# Patient Record
Sex: Female | Born: 1995 | Race: Black or African American | Hispanic: No | Marital: Single | Smoking: Never smoker
Health system: Southern US, Community
[De-identification: ages and names within clinical notes are randomized; demographics above are authoritative.]

## PROBLEM LIST (undated history)

## (undated) DIAGNOSIS — R519 Headache, unspecified: Secondary | ICD-10-CM

## (undated) DIAGNOSIS — R5383 Other fatigue: Secondary | ICD-10-CM

## (undated) DIAGNOSIS — Z789 Other specified health status: Secondary | ICD-10-CM

## (undated) DIAGNOSIS — R011 Cardiac murmur, unspecified: Secondary | ICD-10-CM

## (undated) DIAGNOSIS — I499 Cardiac arrhythmia, unspecified: Secondary | ICD-10-CM

## (undated) DIAGNOSIS — F32A Depression, unspecified: Secondary | ICD-10-CM

## (undated) DIAGNOSIS — R079 Chest pain, unspecified: Secondary | ICD-10-CM

## (undated) HISTORY — DX: Cardiac murmur, unspecified: R01.1

## (undated) HISTORY — DX: Chest pain, unspecified: R07.9

## (undated) HISTORY — DX: Other fatigue: R53.83

## (undated) HISTORY — DX: Headache, unspecified: R51.9

## (undated) HISTORY — DX: Depression, unspecified: F32.A

## (undated) HISTORY — DX: Cardiac arrhythmia, unspecified: I49.9

---
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
## 2019-08-27 ENCOUNTER — Emergency Department (HOSPITAL_COMMUNITY)
Admission: EM | Admit: 2019-08-27 | Discharge: 2019-08-28 | Disposition: A | Discharge status: Home / Self Care | Payer: Self-pay | Attending: Emergency Medicine | Admitting: Emergency Medicine

## 2019-08-27 ENCOUNTER — Other Ambulatory Visit: Payer: Self-pay

## 2019-08-27 ENCOUNTER — Encounter (HOSPITAL_COMMUNITY): Payer: Self-pay | Admitting: *Deleted

## 2019-08-27 DIAGNOSIS — R109 Unspecified abdominal pain: Secondary | ICD-10-CM

## 2019-08-27 DIAGNOSIS — R52 Pain, unspecified: Secondary | ICD-10-CM

## 2019-08-27 DIAGNOSIS — R5383 Other fatigue: Secondary | ICD-10-CM | POA: Insufficient documentation

## 2019-08-27 DIAGNOSIS — Z20828 Contact with and (suspected) exposure to other viral communicable diseases: Secondary | ICD-10-CM | POA: Insufficient documentation

## 2019-08-27 DIAGNOSIS — R05 Cough: Secondary | ICD-10-CM | POA: Insufficient documentation

## 2019-08-27 DIAGNOSIS — R1084 Generalized abdominal pain: Secondary | ICD-10-CM | POA: Insufficient documentation

## 2019-08-27 DIAGNOSIS — R059 Cough, unspecified: Secondary | ICD-10-CM

## 2019-08-27 LAB — URINALYSIS, ROUTINE W REFLEX MICROSCOPIC
Bilirubin Urine: NEGATIVE
Glucose, UA: NEGATIVE mg/dL
Hgb urine dipstick: NEGATIVE
Ketones, ur: NEGATIVE mg/dL
Leukocytes,Ua: NEGATIVE
Nitrite: NEGATIVE
Protein, ur: NEGATIVE mg/dL
Specific Gravity, Urine: 1.016 (ref 1.005–1.030)
pH: 6 (ref 5.0–8.0)

## 2019-08-27 LAB — CBC
HCT: 35.2 % — ABNORMAL LOW (ref 36.0–46.0)
Hemoglobin: 11.8 g/dL — ABNORMAL LOW (ref 12.0–15.0)
MCH: 30.2 pg (ref 26.0–34.0)
MCHC: 33.5 g/dL (ref 30.0–36.0)
MCV: 90 fL (ref 80.0–100.0)
Platelets: 245 10*3/uL (ref 150–400)
RBC: 3.91 MIL/uL (ref 3.87–5.11)
RDW: 13.7 % (ref 11.5–15.5)
WBC: 7.2 10*3/uL (ref 4.0–10.5)
nRBC: 0 % (ref 0.0–0.2)

## 2019-08-27 MED ORDER — SODIUM CHLORIDE 0.9% FLUSH: 3.0000 mL | Freq: Once | INTRAVENOUS | Status: DC

## 2019-08-27 NOTE — ED Triage Notes (Signed)
Pt c/o abd pain, generalized body aches and fatigue x 1 week. Denies known exposure to covid

## 2019-08-28 ENCOUNTER — Emergency Department (HOSPITAL_COMMUNITY): Payer: Self-pay

## 2019-08-28 LAB — COMPREHENSIVE METABOLIC PANEL
ALT: 11 U/L (ref 0–44)
AST: 15 U/L (ref 15–41)
Albumin: 4 g/dL (ref 3.5–5.0)
Alkaline Phosphatase: 56 U/L (ref 38–126)
Anion gap: 9 (ref 5–15)
BUN: 9 mg/dL (ref 6–20)
CO2: 23 mmol/L (ref 22–32)
Calcium: 9.9 mg/dL (ref 8.9–10.3)
Chloride: 106 mmol/L (ref 98–111)
Creatinine, Ser: 0.77 mg/dL (ref 0.44–1.00)
GFR calc Af Amer: >60 mL/min (ref >60)
GFR calc non Af Amer: >60 mL/min (ref >60)
Glucose, Bld: 106 mg/dL — ABNORMAL HIGH (ref 70–99)
Potassium: 3.6 mmol/L (ref 3.5–5.1)
Sodium: 138 mmol/L (ref 135–145)
Total Bilirubin: 0.6 mg/dL (ref 0.3–1.2)
Total Protein: 7.3 g/dL (ref 6.5–8.1)

## 2019-08-28 LAB — I-STAT BETA HCG BLOOD, ED (MC, WL, AP ONLY): I-stat hCG, quantitative: <5 m[IU]/mL (ref <5)

## 2019-08-28 NOTE — ED Provider Notes (Signed)
[facility] EMERGENCY DEPARTMENT Provider Note   CSN: [id number] Arrival date & time: 08/27/19  2307     History   Chief Complaint Chief Complaint  Patient presents with  . Abdominal Pain    HPI Madison Rocha is a 23 y.o. female.     Patient presents to the emergency department with a chief complaint of "pain all over."  She states that she has been having generalized fatigue and body aches x1 week.  She reports some associated cough.  She denies any fevers.  Denies any known coronavirus exposures.  Denies any successful treatments prior to arrival.  Nothing makes symptoms better or worse.  Madison Rocha was evaluated in Emergency Department on 08/28/2019 for the symptoms described in the history of present illness. She was evaluated in the context of the global COVID-19 pandemic, which necessitated consideration that the patient might be at risk for infection with the SARS-CoV-2 virus that causes COVID-19. Institutional protocols and algorithms that pertain to the evaluation of patients at risk for COVID-19 are in a state of rapid change based on information released by regulatory bodies including the CDC and federal and state organizations. These policies and algorithms were followed during the patient's care in the ED.   The history is provided by the patient. No language interpreter was used.    History reviewed. No pertinent past medical history.  There are no active problems to display for this patient.   History reviewed. No pertinent surgical history.   OB History   No obstetric history on file.      Home Medications    Prior to Admission medications   Not on File    Family History No family history on file.  Social History Social History   Tobacco Use  . Smoking status: Never Smoker  . Smokeless tobacco: Never Used  Substance Use Topics  . Alcohol use: Never    Frequency: Never  . Drug use: Never     Allergies    Patient has no known allergies.   Review of Systems Review of Systems  All other systems reviewed and are negative.    Physical Exam Updated Vital Signs BP 130/73 (BP Location: Left Arm)   Pulse 60   Temp 98.2 F (36.8 C) (Oral)   Resp 17   LMP 08/20/2019   SpO2 100%   Physical Exam Vitals signs and nursing note reviewed.  Constitutional:      General: She is not in acute distress.    Appearance: She is well-developed.  HENT:     Head: Normocephalic and atraumatic.  Eyes:     Conjunctiva/sclera: Conjunctivae normal.  Neck:     Musculoskeletal: Neck supple.  Cardiovascular:     Rate and Rhythm: Normal rate and regular rhythm.     Heart sounds: No murmur.  Pulmonary:     Effort: Pulmonary effort is normal. No respiratory distress.     Breath sounds: Normal breath sounds.  Abdominal:     Palpations: Abdomen is soft.     Tenderness: There is no abdominal tenderness.  Musculoskeletal: Normal range of motion.  Skin:    General: Skin is warm and dry.  Neurological:     Mental Status: She is alert and oriented to person, place, and time.  Psychiatric:        Mood and Affect: Mood normal.        Behavior: Behavior normal.      ED Treatments / Results  Labs (all labs  ordered are listed, but only abnormal results are displayed) Labs Reviewed  COMPREHENSIVE METABOLIC PANEL - Abnormal; Notable for the following components:      Result Value   Glucose, Bld 106 (*)    All other components within normal limits  CBC - Abnormal; Notable for the following components:   Hemoglobin 11.8 (*)    HCT 35.2 (*)    All other components within normal limits  NOVEL CORONAVIRUS, NAA (HOSP ORDER, SEND-OUT TO REF LAB; TAT 18-24 HRS)  URINALYSIS, ROUTINE W REFLEX MICROSCOPIC  I-STAT BETA HCG BLOOD, ED ([facility], [facility], [facility] ONLY)    EKG None  Radiology No results found.  Procedures Procedures (including critical care time)  Medications Ordered in ED Medications  sodium chloride  flush (NS) 0.9 % injection 3 mL (has no administration in time range)     Initial Impression / Assessment and Plan / ED Course  I have reviewed the triage vital signs and the nursing notes.  Pertinent labs & imaging results that were available during my care of the patient were reviewed by me and considered in my medical decision making (see chart for details).        Patient with generalized fatigue and body aches.  Afebrile here.  Laboratory work-up is reassuring.  She has had some cough.  Will check chest x-ray and Covid.  Recommend isolating at home until Covid results.  Otherwise, doubt the need for any further emergent work-up.  Patient can follow-up with PCP.  Final Clinical Impressions(s) / ED Diagnoses   Final diagnoses:  Cough  Body aches  Fatigue, unspecified type  Abdominal pain, unspecified abdominal location    ED Discharge Orders    None       Montine Circle, PA-C 08/28/19 4801    Veryl Speak, MD 08/28/19 [phone]

## 2019-08-28 NOTE — ED Notes (Signed)
Patient verbalizes understanding of discharge instructions. Opportunity for questioning and answers were provided. Armband removed by staff, pt discharged from ED.  

## 2019-08-28 NOTE — Discharge Instructions (Addendum)
You need to isolate at home until your COVID test results.  No clear cause of your symptoms was found tonight.  It is possible that you have COVID.  If the test results are negative, you can return to your normal routine and follow-up with your doctor.  If the COVID test is positive, you will need to isolate for 7 days.      Person Under Monitoring Name: Madison Rocha  Location: [place] [address]   Infection Prevention Recommendations for Individuals Confirmed to have, or Being Evaluated for, 2019 Novel Coronavirus (COVID-19) Infection Who Receive Care at Home  Individuals who are confirmed to have, or are being evaluated for, COVID-19 should follow the prevention steps below until a healthcare provider or local or state health department says they can return to normal activities.  Stay home except to get medical care You should restrict activities outside your home, except for getting medical care. Do not go to work, school, or public areas, and do not use public transportation or taxis.  Call ahead before visiting your doctor Before your medical appointment, call the healthcare provider and tell them that you have, or are being evaluated for, COVID-19 infection. This will help the healthcare providers office take steps to keep other people from getting infected. Ask your healthcare provider to call the local or state health department.  Monitor your symptoms Seek prompt medical attention if your illness is worsening (e.g., difficulty breathing). Before going to your medical appointment, call the healthcare provider and tell them that you have, or are being evaluated for, COVID-19 infection. Ask your healthcare provider to call the local or state health department.  Wear a facemask You should wear a facemask that covers your nose and mouth when you are in the same room with other people and when you visit a healthcare provider. People who live  with or visit you should also wear a facemask while they are in the same room with you.  Separate yourself from other people in your home As much as possible, you should stay in a different room from other people in your home. Also, you should use a separate bathroom, if available.  Avoid sharing household items You should not share dishes, drinking glasses, cups, eating utensils, towels, bedding, or other items with other people in your home. After using these items, you should wash them thoroughly with soap and water.  Cover your coughs and sneezes Cover your mouth and nose with a tissue when you cough or sneeze, or you can cough or sneeze into your sleeve. Throw used tissues in a lined trash can, and immediately wash your hands with soap and water for at least 20 seconds or use an alcohol-based hand rub.  Wash your [employer] your hands often and thoroughly with soap and water for at least 20 seconds. You can use an alcohol-based hand sanitizer if soap and water are not available and if your hands are not visibly dirty. Avoid touching your eyes, nose, and mouth with unwashed hands.   Prevention Steps for Caregivers and Household Members of Individuals Confirmed to have, or Being Evaluated for, COVID-19 Infection Being Cared for in the Home  If you live with, or provide care at home for, a person confirmed to have, or being evaluated for, COVID-19 infection please follow these guidelines to prevent infection:  Follow healthcare providers instructions Make sure that you understand and can help the patient follow any healthcare provider instructions for all care.  Provide for the patients basic needs You should help the patient with basic needs in the home and provide support for getting groceries, prescriptions, and other personal needs.  Monitor the patients symptoms If they are getting sicker, call his or her medical provider and tell them that the patient has, or is being  evaluated for, COVID-19 infection. This will help the healthcare providers office take steps to keep other people from getting infected. Ask the healthcare provider to call the local or state health department.  Limit the number of people who have contact with the patient If possible, have only one caregiver for the patient. Other household members should stay in another home or place of residence. If this is not possible, they should stay in another room, or be separated from the patient as much as possible. Use a separate bathroom, if available. Restrict visitors who do not have an essential need to be in the home.  Keep older adults, very young children, and other sick people away from the patient Keep older adults, very young children, and those who have compromised immune systems or chronic health conditions away from the patient. This includes people with chronic heart, lung, or kidney conditions, diabetes, and cancer.  Ensure good ventilation Make sure that shared spaces in the home have good air flow, such as from an air conditioner or an opened window, weather permitting.  Wash your hands often Wash your hands often and thoroughly with soap and water for at least 20 seconds. You can use an alcohol based hand sanitizer if soap and water are not available and if your hands are not visibly dirty. Avoid touching your eyes, nose, and mouth with unwashed hands. Use disposable paper towels to dry your hands. If not available, use dedicated cloth towels and replace them when they become wet.  Wear a facemask and gloves Wear a disposable facemask at all times in the room and gloves when you touch or have contact with the patients blood, body fluids, and/or secretions or excretions, such as sweat, saliva, sputum, nasal mucus, vomit, urine, or feces.  Ensure the mask fits over your nose and mouth tightly, and do not touch it during use. Throw out disposable facemasks and gloves after using  them. Do not reuse. Wash your hands immediately after removing your facemask and gloves. If your personal clothing becomes contaminated, carefully remove clothing and launder. Wash your hands after handling contaminated clothing. Place all used disposable facemasks, gloves, and other waste in a lined container before disposing them with other household waste. Remove gloves and wash your hands immediately after handling these items.  Do not share dishes, glasses, or other household items with the patient Avoid sharing household items. You should not share dishes, drinking glasses, cups, eating utensils, towels, bedding, or other items with a patient who is confirmed to have, or being evaluated for, COVID-19 infection. After the person uses these items, you should wash them thoroughly with soap and water.  Wash laundry thoroughly Immediately remove and wash clothes or bedding that have blood, body fluids, and/or secretions or excretions, such as sweat, saliva, sputum, nasal mucus, vomit, urine, or feces, on them. Wear gloves when handling laundry from the patient. Read and follow directions on labels of laundry or clothing items and detergent. In general, wash and dry with the warmest temperatures recommended on the label.  Clean all areas the individual has used often Clean all touchable surfaces, such as counters, tabletops, doorknobs, bathroom fixtures, toilets, phones, keyboards, tablets,  and bedside tables, every day. Also, clean any surfaces that may have blood, body fluids, and/or secretions or excretions on them. Wear gloves when cleaning surfaces the patient has come in contact with. Use a diluted bleach solution (e.g., dilute bleach with 1 part bleach and 10 parts water) or a household disinfectant with a label that says EPA-registered for coronaviruses. To make a bleach solution at home, add 1 tablespoon of bleach to 1 quart (4 cups) of water. For a larger supply, add  cup of bleach to 1  gallon (16 cups) of water. Read labels of cleaning products and follow recommendations provided on product labels. Labels contain instructions for safe and effective use of the cleaning product including precautions you should take when applying the product, such as wearing gloves or eye protection and making sure you have good ventilation during use of the product. Remove gloves and wash hands immediately after cleaning.  Monitor yourself for signs and symptoms of illness Caregivers and household members are considered close contacts, should monitor their health, and will be asked to limit movement outside of the home to the extent possible. Follow the monitoring steps for close contacts listed on the symptom monitoring form.   ? If you have additional questions, contact your local health department or call the epidemiologist on call at [phone] (available 24/7). ? This guidance is subject to change. For the most up-to-date guidance from [facility], please refer to their website: TripMetro.huhttps://www.cdc.gov/coronavirus/2019-ncov/hcp/guidance-prevent-spread.html

## 2019-08-29 ENCOUNTER — Telehealth (HOSPITAL_COMMUNITY): Payer: Self-pay

## 2019-08-29 LAB — NOVEL CORONAVIRUS, NAA (HOSP ORDER, SEND-OUT TO REF LAB; TAT 18-24 HRS): SARS-CoV-2, NAA: NOT DETECTED

## 2019-10-16 DIAGNOSIS — Z3201 Encounter for pregnancy test, result positive: Secondary | ICD-10-CM | POA: Diagnosis not present

## 2019-10-26 NOTE — L&D Delivery Note (Signed)
Delivery Note:   G2P1001 at [redacted]w[redacted]d  Admitting diagnosis: Normal labor [O80, Z37.9] Risks: None Onset of labor: 06/19/2020 @ 0400 IOL/Augmentation: N/A ROM: AROM @ 1238  Complete dilation at 06/19/2020  1251 Onset of pushing at 1251 FHR second stage Cat 1  Analgesia /Anesthesia intrapartum:None  Pushing in lithotomy position with CNM and L&D staff support, pt's mother present for birth and supportive.  Delivery of a Live born female  Birth Weight:  3711 gms, 8lbs 2.9oz APGAR: 9, 9   Newborn Delivery   Birth date/time: 06/19/2020 13:00:00 Delivery type: Vaginal, spontaneous      in cephalic presentation, position OA to LOA.  APGAR:1 min-9 , 5 min-9  10 min-  Nuchal Cord: No  Cord double clamped after cessation of pulsation, cut by mother.  Collection of cord blood for typing completed. Cord blood donation-None  Arterial cord blood sample-No    Placenta delivered-Spontaneous  with 3 vessels . Uterotonics: Pitocin, methergine IM Placenta to L&D. Uterine tone firm bleeding stable  1st degree  laceration identified.  Episiotomy:None  Local analgesia: Lidocaine  Repair: 3-0 in the usual fashion, excellent hemostasis Est. Blood Loss (mL):300.00   Complications: None  Mom to postpartum.  Darlen Round to Couplet care / Skin to Skin.  Delivery Report:   Review the Delivery Report for details.    Signed: Clancy Gourd, MSN 06/19/2020, 3:04 PM

## 2019-10-28 ENCOUNTER — Encounter (HOSPITAL_COMMUNITY): Payer: Self-pay

## 2019-10-28 ENCOUNTER — Inpatient Hospital Stay (HOSPITAL_COMMUNITY)
Admission: AD | Admit: 2019-10-28 | Discharge: 2019-10-29 | Disposition: A | Discharge status: Home / Self Care | Payer: No Typology Code available for payment source | Attending: Obstetrics and Gynecology | Admitting: Obstetrics and Gynecology

## 2019-10-28 DIAGNOSIS — R109 Unspecified abdominal pain: Secondary | ICD-10-CM | POA: Diagnosis not present

## 2019-10-28 DIAGNOSIS — O26899 Other specified pregnancy related conditions, unspecified trimester: Secondary | ICD-10-CM

## 2019-10-28 DIAGNOSIS — O26891 Other specified pregnancy related conditions, first trimester: Secondary | ICD-10-CM | POA: Diagnosis not present

## 2019-10-28 DIAGNOSIS — S3991XA Unspecified injury of abdomen, initial encounter: Secondary | ICD-10-CM

## 2019-10-28 DIAGNOSIS — W208XXA Other cause of strike by thrown, projected or falling object, initial encounter: Secondary | ICD-10-CM | POA: Diagnosis not present

## 2019-10-28 DIAGNOSIS — Z3A01 Less than 8 weeks gestation of pregnancy: Secondary | ICD-10-CM | POA: Diagnosis not present

## 2019-10-28 LAB — COMPREHENSIVE METABOLIC PANEL
ALT: 13 U/L (ref 0–44)
AST: 17 U/L (ref 15–41)
Albumin: 4.2 g/dL (ref 3.5–5.0)
Alkaline Phosphatase: 57 U/L (ref 38–126)
Anion gap: 8 (ref 5–15)
BUN: 9 mg/dL (ref 6–20)
CO2: 22 mmol/L (ref 22–32)
Calcium: 9.4 mg/dL (ref 8.9–10.3)
Chloride: 103 mmol/L (ref 98–111)
Creatinine, Ser: 0.61 mg/dL (ref 0.44–1.00)
GFR calc Af Amer: >60 mL/min (ref >60)
GFR calc non Af Amer: >60 mL/min (ref >60)
Glucose, Bld: 97 mg/dL (ref 70–99)
Potassium: 4.3 mmol/L (ref 3.5–5.1)
Sodium: 133 mmol/L — ABNORMAL LOW (ref 135–145)
Total Bilirubin: 0.2 mg/dL — ABNORMAL LOW (ref 0.3–1.2)
Total Protein: 7.3 g/dL (ref 6.5–8.1)

## 2019-10-28 LAB — URINALYSIS, ROUTINE W REFLEX MICROSCOPIC
Bacteria, UA: NONE SEEN
Bilirubin Urine: NEGATIVE
Glucose, UA: NEGATIVE mg/dL
Hgb urine dipstick: NEGATIVE
Ketones, ur: NEGATIVE mg/dL
Nitrite: NEGATIVE
Protein, ur: NEGATIVE mg/dL
Specific Gravity, Urine: 1.016 (ref 1.005–1.030)
pH: 6 (ref 5.0–8.0)

## 2019-10-28 LAB — CBC
HCT: 37.8 % (ref 36.0–46.0)
Hemoglobin: 12.4 g/dL (ref 12.0–15.0)
MCH: 30.1 pg (ref 26.0–34.0)
MCHC: 32.8 g/dL (ref 30.0–36.0)
MCV: 91.7 fL (ref 80.0–100.0)
Platelets: 257 10*3/uL (ref 150–400)
RBC: 4.12 MIL/uL (ref 3.87–5.11)
RDW: 14 % (ref 11.5–15.5)
WBC: 10.3 10*3/uL (ref 4.0–10.5)
nRBC: 0 % (ref 0.0–0.2)

## 2019-10-28 LAB — LIPASE, BLOOD: Lipase: 22 U/L (ref 11–51)

## 2019-10-28 LAB — I-STAT BETA HCG BLOOD, ED (MC, WL, AP ONLY): I-stat hCG, quantitative: >2000 m[IU]/mL — ABNORMAL HIGH (ref <5)

## 2019-10-28 MED ORDER — SODIUM CHLORIDE 0.9% FLUSH: 3.0000 mL | Freq: Once | INTRAVENOUS | Status: DC
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
## 2019-10-29 ENCOUNTER — Encounter (HOSPITAL_COMMUNITY): Payer: Self-pay | Admitting: Obstetrics and Gynecology

## 2019-10-29 ENCOUNTER — Inpatient Hospital Stay (HOSPITAL_COMMUNITY): Payer: Medicaid Other | Attending: Family Medicine

## 2019-10-29 ENCOUNTER — Other Ambulatory Visit: Payer: Self-pay

## 2019-10-29 DIAGNOSIS — O26891 Other specified pregnancy related conditions, first trimester: Secondary | ICD-10-CM | POA: Diagnosis not present

## 2019-10-29 DIAGNOSIS — Z3A01 Less than 8 weeks gestation of pregnancy: Secondary | ICD-10-CM | POA: Insufficient documentation

## 2019-10-29 DIAGNOSIS — R109 Unspecified abdominal pain: Secondary | ICD-10-CM | POA: Insufficient documentation

## 2019-10-29 LAB — TYPE AND SCREEN
ABO/RH(D): AB POS
Antibody Screen: NEGATIVE

## 2019-10-29 LAB — ABO/RH: ABO/RH(D): AB POS

## 2019-10-29 LAB — HCG, QUANTITATIVE, PREGNANCY: hCG, Beta Chain, Quant, S: 66049 m[IU]/mL — ABNORMAL HIGH (ref <5)

## 2019-10-29 NOTE — ED Notes (Signed)
[facility] in [facility] approved transport to them.

## 2019-10-29 NOTE — Discharge Instructions (Signed)
Abdominal Pain During Pregnancy  Belly (abdominal) pain is common during pregnancy. There are many possible causes. Most of the time, it is not a serious problem. Other times, it can be a sign that something is wrong with the pregnancy. Always tell your doctor if you have belly pain. Follow these instructions at home:  Do not have sex or put anything in your vagina until your pain goes away completely.  Get plenty of rest until your pain gets better.  Drink enough fluid to keep your pee (urine) pale yellow.  Take over-the-counter and prescription medicines only as told by your doctor.  Keep all follow-up visits as told by your doctor. This is important. Contact a doctor if:  Your pain continues or gets worse after resting.  You have lower belly pain that: ? Comes and goes at regular times. ? Spreads to your back. ? Feels like menstrual cramps.  You have pain or burning when you pee (urinate). Get help right away if:  You have a fever or chills.  You have vaginal bleeding.  You are leaking fluid from your vagina.  You are passing tissue from your vagina.  You throw up (vomit) for more than 24 hours.  You have watery poop (diarrhea) for more than 24 hours.  Your baby is moving less than usual.  You feel very weak or faint.  You have shortness of breath.  You have very bad pain in your upper belly. Summary  Belly (abdominal) pain is common during pregnancy. There are many possible causes.  If you have belly pain during pregnancy, tell your doctor right away.  Keep all follow-up visits as told by your doctor. This is important. This information is not intended to replace advice given to you by your health care provider. Make sure you discuss any questions you have with your health care provider. Document Revised: 01/29/2019 Document Reviewed: 01/13/2017 Elsevier Patient Education  2020 Elsevier Inc.   [place] [facility] Providers    Center for [employer] at [facility]       Phone: [phone]  Center for [employer] at [place]   Phone: [phone]  Center for [employer] at [place]  Phone: [phone]  Center for [facility] at [facility]  Phone: [phone]  Center for [facility] at [place]  Phone: [phone]  [facility] for [facility] at [facility]   Phone: [phone]  [facility]       Phone: [phone]  [facility]    Phone: [phone]   [facility]    Phone: [phone]  [facility]    Phone: [phone]  [facility]    Phone: [phone]  [facility]-Family Planning       Phone: [phone]   [facility]  Phone: [phone]  [facility]    Phone: [phone]  [facility]   Phone: [phone]  [facility]      Phone: [phone]  [facility]    Phone: [phone]
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
## 2019-10-29 NOTE — MAU Provider Note (Signed)
History     CSN: [id number]  Arrival date and time: 10/28/19 2052   None     Chief Complaint  Patient presents with  . Abdominal Pain   HPI   Madison Rocha is a 24 yo G2P1001 who is unsure about her LMP or EGA. She presents to MAU for abdominal pain after having a box fall on her abdomen. She works for [employer], and is unsure of how large the box was or from how high up it fell because it happened so fast. She denies vaginal bleeding or cramping. Her abdomen is still a little tender from where the box fell, which was on the right lower part of her abdomen. She denies any bleeding, bruising, or lacerations to the abdomen. She says her pain is a throbbing, and it does not radiate anywhere.  OB History    Gravida  2   Para  1   Term  1   Preterm      AB      Living  1     SAB      TAB      Ectopic      Multiple      Live Births  1           History reviewed. No pertinent past medical history.  History reviewed. No pertinent surgical history.  History reviewed. No pertinent family history.  Social History   Tobacco Use  . Smoking status: Never Smoker  . Smokeless tobacco: Never Used  Substance Use Topics  . Alcohol use: Never  . Drug use: Never    Allergies: No Known Allergies  No medications prior to admission.    Review of Systems  All other systems reviewed and are negative.  Physical Exam   Blood pressure 127/73, pulse 68, temperature 98.8 F (37.1 C), temperature source Oral, resp. rate 16, height 5\' 8"  (1.727 m), weight 74.7 kg, last menstrual period 09/10/2019, SpO2 100 %.  Physical Exam  Nursing note and vitals reviewed. Constitutional: She is oriented to person, place, and time. She appears well-developed and well-nourished.  HENT:  Head: Normocephalic and atraumatic.  Eyes: Pupils are equal, round, and reactive to light. Conjunctivae and EOM are normal.  Cardiovascular: Normal rate, regular rhythm, normal heart sounds and intact distal  pulses.  Respiratory: Effort normal and breath sounds normal.  GI: Soft. Bowel sounds are normal. She exhibits no distension and no mass. There is abdominal tenderness (mild over RLQ, no gaurding or rigidity). There is no rebound and no guarding.  Musculoskeletal:        General: Normal range of motion.     Cervical back: Normal range of motion and neck supple.  Neurological: She is alert and oriented to person, place, and time. She has normal reflexes.  Skin: Skin is warm and dry.  Psychiatric: She has a normal mood and affect. Her behavior is normal. Judgment and thought content normal.    MAU Course  Procedures  MDM -Lab work in ED unremarkable -[place] ordered for dating and viability, bHCG quant -Type and Screen  Results for orders placed or performed during the hospital encounter of 10/28/19 (from the past 24 hour(s))  Urinalysis, Routine w reflex microscopic     Status: Abnormal   Collection Time: 10/28/19  8:59 PM  Result Value Ref Range   Color, Urine YELLOW YELLOW   APPearance HAZY (A) CLEAR   Specific Gravity, Urine 1.016 1.005 - 1.030   pH 6.0 5.0 - 8.0  Glucose, UA NEGATIVE NEGATIVE mg/dL   Hgb urine dipstick NEGATIVE NEGATIVE   Bilirubin Urine NEGATIVE NEGATIVE   Ketones, ur NEGATIVE NEGATIVE mg/dL   Protein, ur NEGATIVE NEGATIVE mg/dL   Nitrite NEGATIVE NEGATIVE   Leukocytes,Ua TRACE (A) NEGATIVE   RBC / HPF 0-5 0 - 5 RBC/hpf   WBC, UA 0-5 0 - 5 WBC/hpf   Bacteria, UA NONE SEEN NONE SEEN   Squamous Epithelial / LPF 0-5 0 - 5   Mucus PRESENT   Lipase, blood     Status: None   Collection Time: 10/28/19  9:12 PM  Result Value Ref Range   Lipase 22 11 - 51 U/L  Comprehensive metabolic panel     Status: Abnormal   Collection Time: 10/28/19  9:12 PM  Result Value Ref Range   Sodium 133 (L) 135 - 145 mmol/L   Potassium 4.3 3.5 - 5.1 mmol/L   Chloride 103 98 - 111 mmol/L   CO2 22 22 - 32 mmol/L   Glucose, Bld 97 70 - 99 mg/dL   BUN 9 6 - 20 mg/dL   Creatinine,  Ser 6.60 0.44 - 1.00 mg/dL   Calcium 9.4 8.9 - 63.0 mg/dL   Total Protein 7.3 6.5 - 8.1 g/dL   Albumin 4.2 3.5 - 5.0 g/dL   AST 17 15 - 41 U/L   ALT 13 0 - 44 U/L   Alkaline Phosphatase 57 38 - 126 U/L   Total Bilirubin 0.2 (L) 0.3 - 1.2 mg/dL   GFR calc non Af Amer >60 >60 mL/min   GFR calc Af Amer >60 >60 mL/min   Anion gap 8 5 - 15  CBC     Status: None   Collection Time: 10/28/19  9:12 PM  Result Value Ref Range   WBC 10.3 4.0 - 10.5 K/uL   RBC 4.12 3.87 - 5.11 MIL/uL   Hemoglobin 12.4 12.0 - 15.0 g/dL   HCT 16.0 10.9 - 32.3 %   MCV 91.7 80.0 - 100.0 fL   MCH 30.1 26.0 - 34.0 pg   MCHC 32.8 30.0 - 36.0 g/dL   RDW 55.7 32.2 - 02.5 %   Platelets 257 150 - 400 K/uL   nRBC 0.0 0.0 - 0.2 %  I-Stat beta hCG blood, ED     Status: Abnormal   Collection Time: 10/28/19  9:22 PM  Result Value Ref Range   I-stat hCG, quantitative >2,000.0 (H) <5 mIU/mL   Comment 3          hCG, quantitative, pregnancy     Status: Abnormal   Collection Time: 10/29/19  4:16 AM  Result Value Ref Range   hCG, Beta Chain, Quant, S 66,049 (H) <5 mIU/mL  Type and screen [facility]     Status: None   Collection Time: 10/29/19  4:17 AM  Result Value Ref Range   ABO/RH(D) AB POS    Antibody Screen NEG    Sample Expiration      11/01/2019,2359 Performed at [facility], [address], [place], [place] [address]    [place] Comp Less 14 Wks  Result Date: 10/29/2019 CLINICAL DATA:  Initial evaluation for acute abdominal pain, early pregnancy. EXAM: OBSTETRIC <14 WK ULTRASOUND TECHNIQUE: Transabdominal ultrasound was performed for evaluation of the gestation as well as the maternal uterus and adnexal regions. COMPARISON:  None. FINDINGS: Intrauterine gestational sac: Single Yolk sac:  Present Embryo:  Present Cardiac Activity: Present Heart Rate: 134 bpm CRL: 10.3  mm   7 w 1 d                  [place] EDC: 06/15/2020 Subchorionic hemorrhage:  None visualized. Maternal uterus/adnexae: Right  ovary normal in appearance. Left ovary not visualized. No adnexal mass or free fluid. IMPRESSION: 1. Single viable intrauterine pregnancy as above, estimated gestational age [redacted] weeks and 1 day by crown-rump length, with ultrasound EDC of 06/15/2020. No complication. 2. No other acute maternal uterine or adnexal abnormality. Electronically Signed   By: Rise Mu M.D.   On: 10/29/2019 05:04     Assessment and Plan  24 yo G2P1001 at 7.1 EGA by [place] who presented to MAU after having a box fall on her abdomen -SIUP on [place], no subchorionic hemorrahge -DC home with expectant management -Tylenol as needed for pain -List of OB providers given  Laresha Bacorn L Breya Cass 10/29/2019, 5:33 AM

## 2019-11-14 DIAGNOSIS — R109 Unspecified abdominal pain: Secondary | ICD-10-CM | POA: Diagnosis not present

## 2019-11-14 DIAGNOSIS — R1084 Generalized abdominal pain: Secondary | ICD-10-CM | POA: Diagnosis not present

## 2019-11-14 DIAGNOSIS — Z3A09 9 weeks gestation of pregnancy: Secondary | ICD-10-CM | POA: Diagnosis not present

## 2019-11-14 DIAGNOSIS — R1031 Right lower quadrant pain: Secondary | ICD-10-CM | POA: Diagnosis not present

## 2019-11-14 DIAGNOSIS — B9689 Other specified bacterial agents as the cause of diseases classified elsewhere: Secondary | ICD-10-CM | POA: Diagnosis not present

## 2019-11-14 DIAGNOSIS — O26891 Other specified pregnancy related conditions, first trimester: Secondary | ICD-10-CM | POA: Diagnosis not present

## 2019-11-14 DIAGNOSIS — R1032 Left lower quadrant pain: Secondary | ICD-10-CM | POA: Diagnosis not present

## 2019-11-14 DIAGNOSIS — O99891 Other specified diseases and conditions complicating pregnancy: Secondary | ICD-10-CM | POA: Diagnosis not present

## 2019-11-14 DIAGNOSIS — O23591 Infection of other part of genital tract in pregnancy, first trimester: Secondary | ICD-10-CM | POA: Diagnosis not present

## 2019-12-12 DIAGNOSIS — Z113 Encounter for screening for infections with a predominantly sexual mode of transmission: Secondary | ICD-10-CM | POA: Diagnosis not present

## 2019-12-12 DIAGNOSIS — Z124 Encounter for screening for malignant neoplasm of cervix: Secondary | ICD-10-CM | POA: Diagnosis not present

## 2019-12-12 DIAGNOSIS — Z3491 Encounter for supervision of normal pregnancy, unspecified, first trimester: Secondary | ICD-10-CM | POA: Diagnosis not present

## 2019-12-12 DIAGNOSIS — Z3201 Encounter for pregnancy test, result positive: Secondary | ICD-10-CM | POA: Diagnosis not present

## 2019-12-12 DIAGNOSIS — Z3A13 13 weeks gestation of pregnancy: Secondary | ICD-10-CM | POA: Diagnosis not present

## 2019-12-12 DIAGNOSIS — O3680X9 Pregnancy with inconclusive fetal viability, other fetus: Secondary | ICD-10-CM | POA: Diagnosis not present

## 2020-01-15 ENCOUNTER — Other Ambulatory Visit (HOSPITAL_COMMUNITY): Payer: Self-pay | Admitting: Obstetrics and Gynecology

## 2020-01-15 DIAGNOSIS — Z3A20 20 weeks gestation of pregnancy: Secondary | ICD-10-CM

## 2020-01-15 DIAGNOSIS — Z3482 Encounter for supervision of other normal pregnancy, second trimester: Secondary | ICD-10-CM | POA: Diagnosis not present

## 2020-01-15 DIAGNOSIS — Z363 Encounter for antenatal screening for malformations: Secondary | ICD-10-CM

## 2020-01-23 ENCOUNTER — Other Ambulatory Visit (HOSPITAL_COMMUNITY): Payer: Self-pay | Admitting: Obstetrics and Gynecology

## 2020-02-01 ENCOUNTER — Ambulatory Visit (HOSPITAL_COMMUNITY): Payer: Medicaid Other

## 2020-02-01 ENCOUNTER — Encounter (HOSPITAL_COMMUNITY): Payer: Self-pay

## 2020-02-11 ENCOUNTER — Other Ambulatory Visit: Payer: Self-pay

## 2020-02-11 ENCOUNTER — Other Ambulatory Visit (HOSPITAL_COMMUNITY): Payer: Self-pay | Admitting: Obstetrics and Gynecology

## 2020-02-11 ENCOUNTER — Ambulatory Visit (HOSPITAL_COMMUNITY)
Admission: RE | Admit: 2020-02-11 | Discharge: 2020-02-11 | Disposition: A | Discharge status: Home / Self Care | Payer: Medicaid Other | Source: Ambulatory Visit | Attending: Obstetrics and Gynecology | Admitting: Obstetrics and Gynecology

## 2020-02-11 DIAGNOSIS — Z3A22 22 weeks gestation of pregnancy: Secondary | ICD-10-CM | POA: Diagnosis not present

## 2020-02-11 DIAGNOSIS — Z363 Encounter for antenatal screening for malformations: Secondary | ICD-10-CM | POA: Diagnosis not present

## 2020-02-11 DIAGNOSIS — Z3A20 20 weeks gestation of pregnancy: Secondary | ICD-10-CM | POA: Insufficient documentation

## 2020-02-11 DIAGNOSIS — O359XX0 Maternal care for (suspected) fetal abnormality and damage, unspecified, not applicable or unspecified: Secondary | ICD-10-CM | POA: Diagnosis not present

## 2020-02-12 ENCOUNTER — Other Ambulatory Visit (HOSPITAL_COMMUNITY): Payer: Self-pay | Admitting: *Deleted

## 2020-02-12 DIAGNOSIS — Z362 Encounter for other antenatal screening follow-up: Secondary | ICD-10-CM

## 2020-02-25 IMAGING — US US OB COMP LESS 14 WK
1 series · 15 of 22 positions shown · non-contrast
Comparison: None.

CLINICAL DATA: Initial evaluation for acute abdominal pain, early
pregnancy.

EXAM:
OBSTETRIC <14 WK ULTRASOUND
TECHNIQUE: Transabdominal ultrasound was performed for evaluation of the
gestation as well as the maternal uterus and adnexal regions.

[Series 1: us ob comp less 14 wk · 22 acquisitions, 15 frames shown]
[im 1/22]
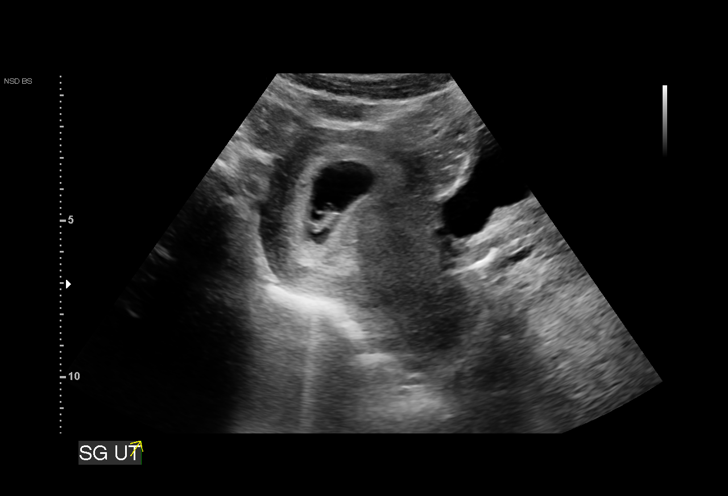
[im 3/22]
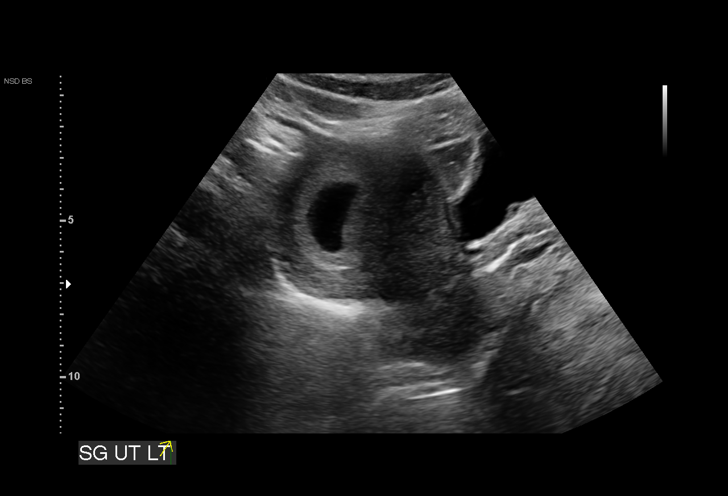
[im 4/22]
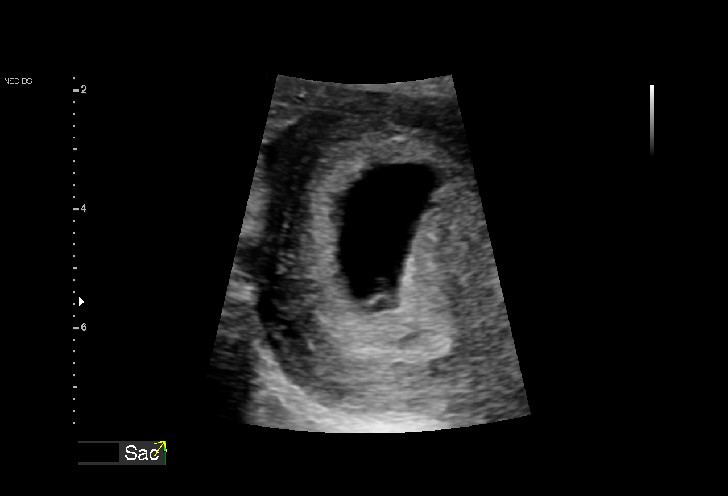
[im 6/22]
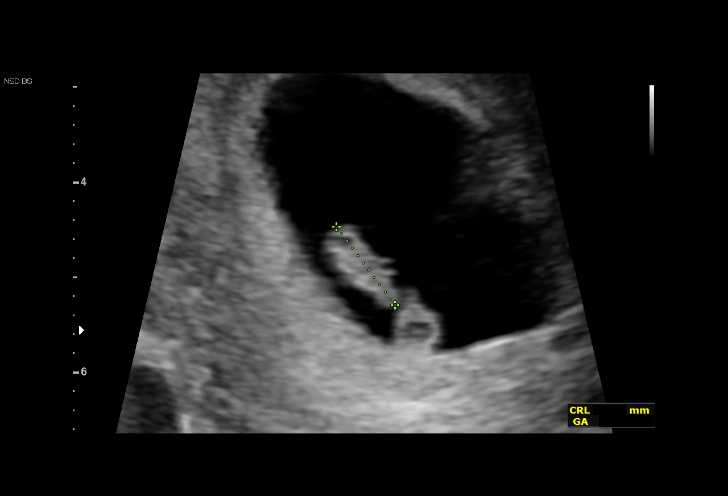
[im 7/22]
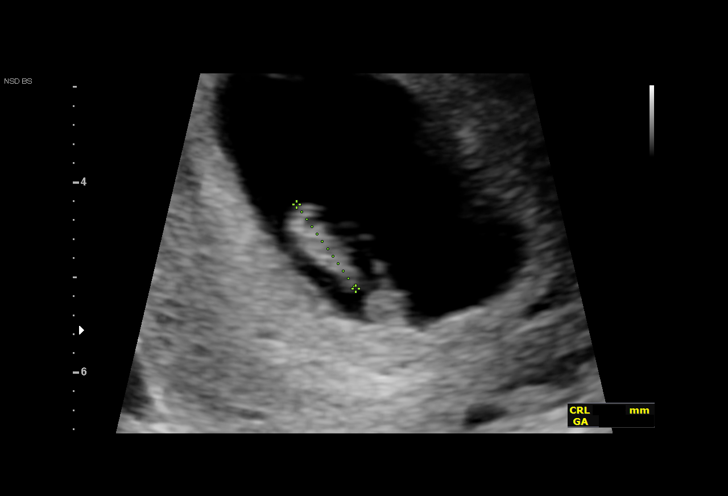
[im 9/22]
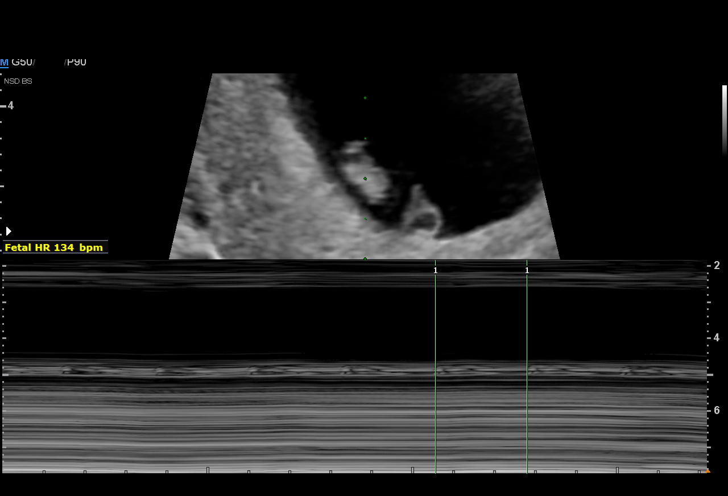
[im 10/22]
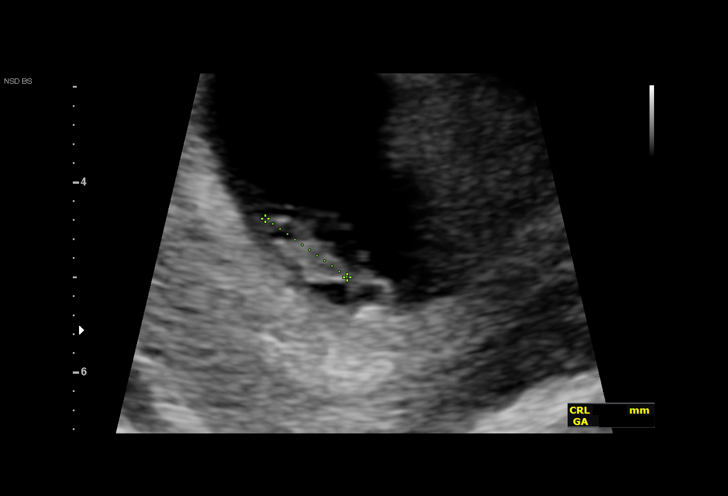
[im 12/22]
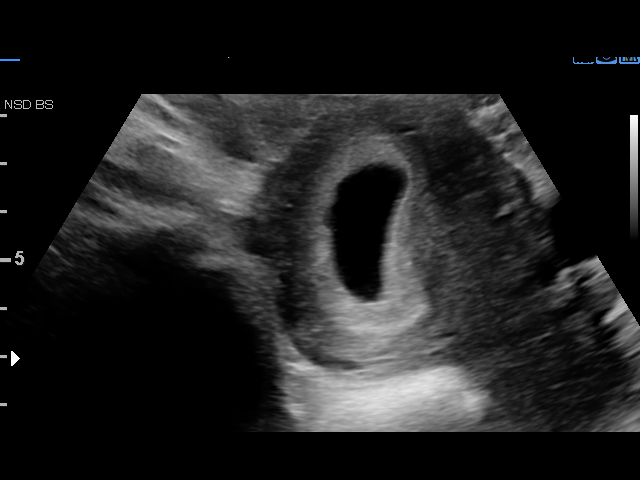
[im 13/22]
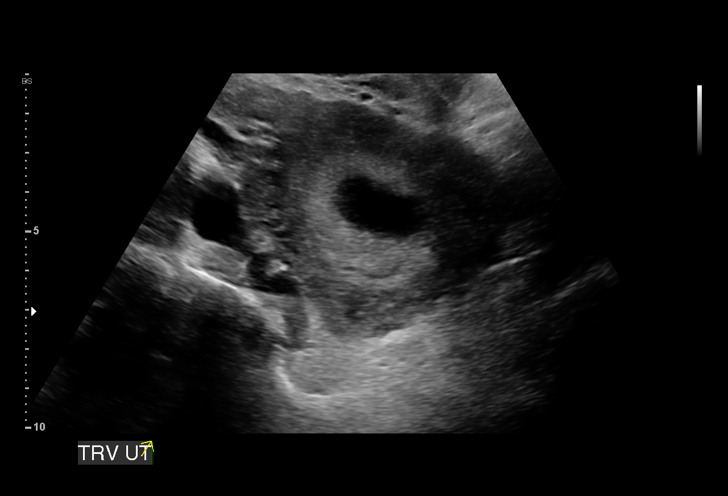
[im 14/22]
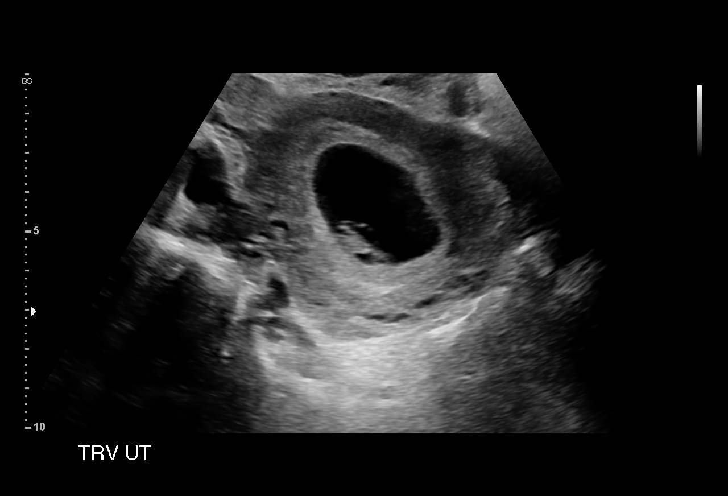
[im 16/22]
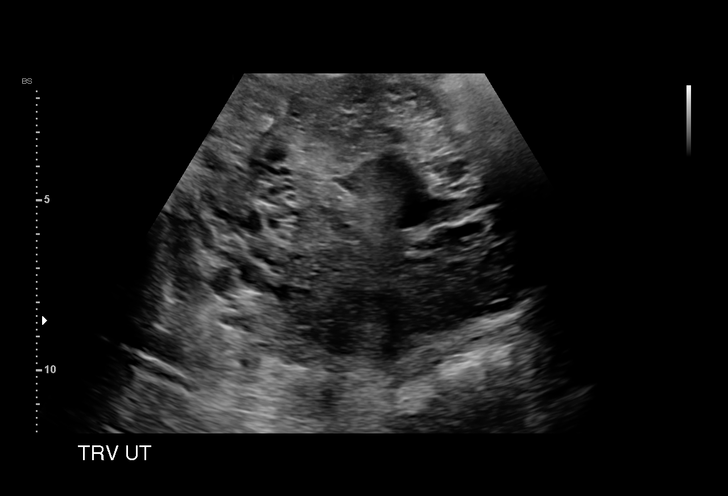
[im 17/22]
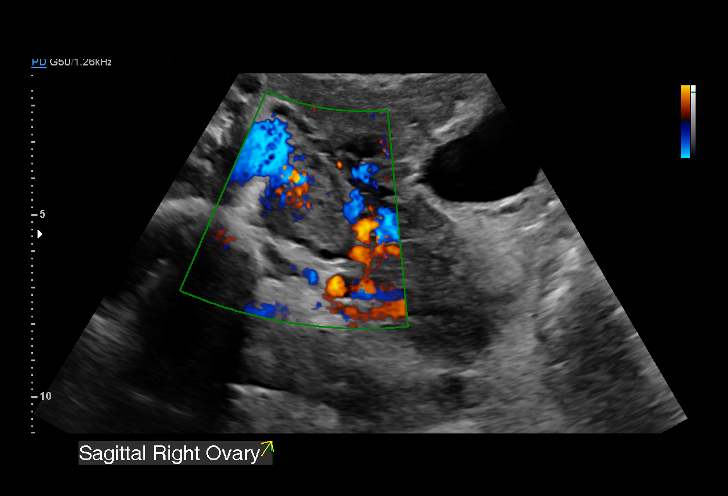
[im 19/22]
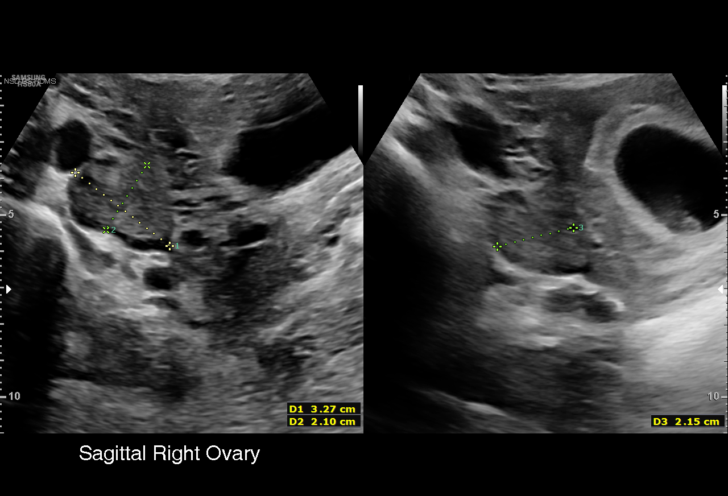
[im 20/22]
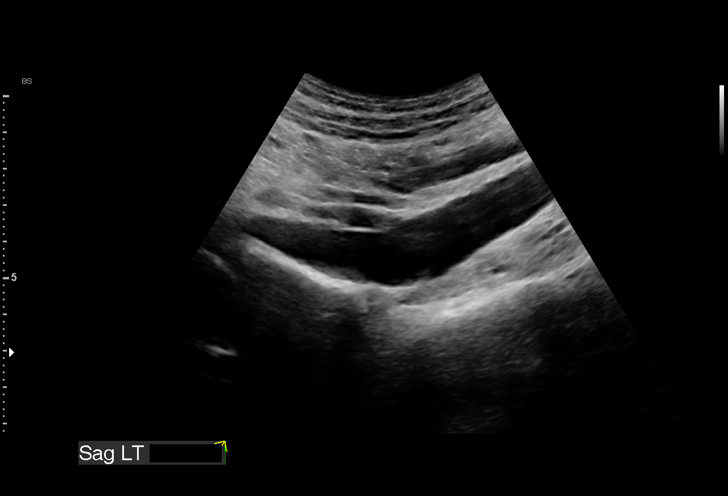
[im 22/22]
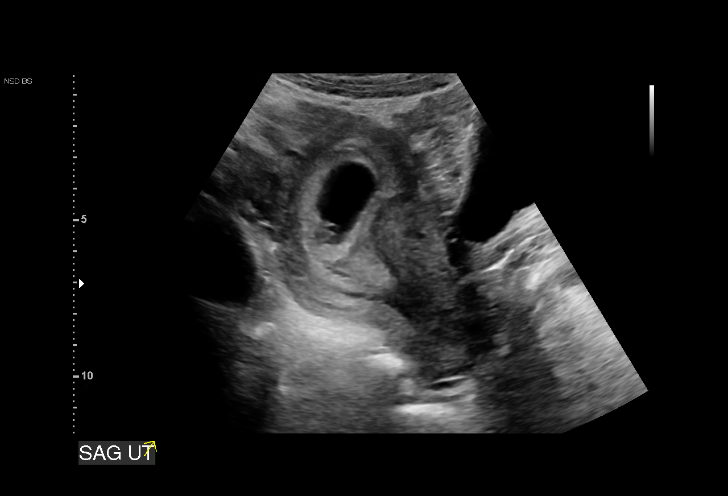

[15 of 22 positions shown; findings below may reference images not displayed]

FINDINGS: Intrauterine gestational sac: Single

Yolk sac:  Present

Embryo:  Present

Cardiac Activity: Present

Heart Rate: 134 bpm

CRL: 10.3 mm   7 w 1 d                  US EDC: 06/15/2020

Subchorionic hemorrhage:  None visualized.

Maternal uterus/adnexae: Right ovary normal in appearance. Left
ovary not visualized. No adnexal mass or free fluid.
IMPRESSION: 1. Single viable intrauterine pregnancy as above, estimated
gestational age 7 weeks and 1 day by crown-rump length, with
ultrasound EDC of 06/15/2020. No complication.
2. No other acute maternal uterine or adnexal abnormality.

## 2020-03-17 DIAGNOSIS — Z3402 Encounter for supervision of normal first pregnancy, second trimester: Secondary | ICD-10-CM | POA: Diagnosis not present

## 2020-03-31 DIAGNOSIS — Z3483 Encounter for supervision of other normal pregnancy, third trimester: Secondary | ICD-10-CM | POA: Diagnosis not present

## 2020-03-31 DIAGNOSIS — Z3A29 29 weeks gestation of pregnancy: Secondary | ICD-10-CM | POA: Diagnosis not present

## 2020-03-31 DIAGNOSIS — Z23 Encounter for immunization: Secondary | ICD-10-CM | POA: Diagnosis not present

## 2020-04-07 ENCOUNTER — Ambulatory Visit (HOSPITAL_COMMUNITY): Payer: Medicaid Other | Attending: Obstetrics and Gynecology

## 2020-04-07 ENCOUNTER — Other Ambulatory Visit: Payer: Self-pay

## 2020-04-07 DIAGNOSIS — O359XX0 Maternal care for (suspected) fetal abnormality and damage, unspecified, not applicable or unspecified: Secondary | ICD-10-CM

## 2020-04-07 DIAGNOSIS — Z362 Encounter for other antenatal screening follow-up: Secondary | ICD-10-CM

## 2020-04-07 DIAGNOSIS — O26843 Uterine size-date discrepancy, third trimester: Secondary | ICD-10-CM

## 2020-04-07 DIAGNOSIS — Z3A30 30 weeks gestation of pregnancy: Secondary | ICD-10-CM

## 2020-05-22 DIAGNOSIS — Z369 Encounter for antenatal screening, unspecified: Secondary | ICD-10-CM | POA: Diagnosis not present

## 2020-05-22 DIAGNOSIS — Z363 Encounter for antenatal screening for malformations: Secondary | ICD-10-CM | POA: Diagnosis not present

## 2020-05-22 DIAGNOSIS — Z331 Pregnant state, incidental: Secondary | ICD-10-CM | POA: Diagnosis not present

## 2020-05-22 DIAGNOSIS — Z113 Encounter for screening for infections with a predominantly sexual mode of transmission: Secondary | ICD-10-CM | POA: Diagnosis not present

## 2020-05-22 DIAGNOSIS — Z8679 Personal history of other diseases of the circulatory system: Secondary | ICD-10-CM | POA: Diagnosis not present

## 2020-06-07 ENCOUNTER — Other Ambulatory Visit: Payer: Self-pay

## 2020-06-07 ENCOUNTER — Encounter (HOSPITAL_COMMUNITY): Payer: Self-pay | Admitting: Internal Medicine

## 2020-06-07 ENCOUNTER — Inpatient Hospital Stay: Admission: AD | Admit: 2020-06-07 | Payer: Medicaid Other | Source: Home / Self Care | Admitting: Internal Medicine

## 2020-06-07 ENCOUNTER — Emergency Department (HOSPITAL_COMMUNITY): Admit: 2020-06-07 | Discharge: 2020-06-07 | Discharge status: Absent without leave | Payer: Medicaid Other

## 2020-06-07 ENCOUNTER — Inpatient Hospital Stay (HOSPITAL_COMMUNITY)
Admission: RE | Admit: 2020-06-07 | Discharge: 2020-06-07 | Disposition: A | Discharge status: Home / Self Care | Payer: Medicaid Other | Attending: Obstetrics and Gynecology | Admitting: Obstetrics and Gynecology

## 2020-06-07 DIAGNOSIS — O471 False labor at or after 37 completed weeks of gestation: Secondary | ICD-10-CM | POA: Diagnosis not present

## 2020-06-07 DIAGNOSIS — O479 False labor, unspecified: Secondary | ICD-10-CM

## 2020-06-07 DIAGNOSIS — Z3A38 38 weeks gestation of pregnancy: Secondary | ICD-10-CM | POA: Insufficient documentation

## 2020-06-07 HISTORY — DX: Other specified health status: Z78.9

## 2020-06-07 NOTE — MAU Provider Note (Signed)
None      S: Ms. Madison Rocha is a 24 y.o. G2P1001 at [redacted]w[redacted]d  who presents to MAU today complaining contractions q 10-15 minutes since 0800. She denies vaginal bleeding. She denies LOF. She reports normal fetal movement.    O: BP 117/67   Pulse 69   Temp 99.2 F (37.3 C) (Oral)   Resp 16   LMP 09/10/2019 (LMP Unknown)   SpO2 99%  GENERAL: Well-developed, well-nourished female in no acute distress.  HEAD: Normocephalic, atraumatic.  CHEST: Normal effort of breathing, regular heart rate ABDOMEN: Soft, nontender, gravid  Cervical exam:  Dilation: 1.5 Effacement (%): 50 Cervical Position: Posterior Station: -2 Presentation: Vertex Exam by:: Marvel Plan RN    Fetal Monitoring: Baseline: 120 Variability: moderate Accelerations: 15x15 Decelerations: none Contractions: occasional uc's  Cervix unchanged after 1.5 hours   A: SIUP at [redacted]w[redacted]d  False labor  P: -Discharge home in stable condition -Labor precautions discussed -Patient advised to follow-up with OB as scheduled for prenatal care -Patient may return to MAU as needed or if her condition were to change or worsen   Rolm Bookbinder, [place] 06/07/2020 2:42 PM

## 2020-06-07 NOTE — Discharge Instructions (Signed)

## 2020-06-07 NOTE — MAU Note (Signed)
Pt reports to mau with c/o ctx since yesterday evening that are about 5-10 min apart.  Pt reports good fetal movement.  Denies LOF or vag bleeding.

## 2020-06-19 ENCOUNTER — Inpatient Hospital Stay (HOSPITAL_COMMUNITY)
Admission: AD | Admit: 2020-06-19 | Discharge: 2020-06-20 | DRG: 806 | Disposition: A | Discharge status: Home / Self Care | Payer: Medicaid Other | Attending: Obstetrics & Gynecology | Admitting: Obstetrics & Gynecology

## 2020-06-19 ENCOUNTER — Encounter (HOSPITAL_COMMUNITY): Payer: Self-pay | Admitting: Obstetrics and Gynecology

## 2020-06-19 ENCOUNTER — Other Ambulatory Visit: Payer: Self-pay

## 2020-06-19 DIAGNOSIS — D62 Acute posthemorrhagic anemia: Secondary | ICD-10-CM | POA: Diagnosis not present

## 2020-06-19 DIAGNOSIS — O9081 Anemia of the puerperium: Secondary | ICD-10-CM | POA: Diagnosis not present

## 2020-06-19 DIAGNOSIS — O9902 Anemia complicating childbirth: Secondary | ICD-10-CM | POA: Diagnosis present

## 2020-06-19 DIAGNOSIS — O26893 Other specified pregnancy related conditions, third trimester: Secondary | ICD-10-CM | POA: Diagnosis not present

## 2020-06-19 DIAGNOSIS — Z3A40 40 weeks gestation of pregnancy: Secondary | ICD-10-CM

## 2020-06-19 DIAGNOSIS — Z20822 Contact with and (suspected) exposure to covid-19: Secondary | ICD-10-CM | POA: Diagnosis not present

## 2020-06-19 DIAGNOSIS — O480 Post-term pregnancy: Secondary | ICD-10-CM | POA: Diagnosis not present

## 2020-06-19 LAB — CBC
HCT: 32 % — ABNORMAL LOW (ref 36.0–46.0)
Hemoglobin: 10.2 g/dL — ABNORMAL LOW (ref 12.0–15.0)
MCH: 26.8 pg (ref 26.0–34.0)
MCHC: 31.9 g/dL (ref 30.0–36.0)
MCV: 84.2 fL (ref 80.0–100.0)
Platelets: 228 10*3/uL (ref 150–400)
RBC: 3.8 MIL/uL — ABNORMAL LOW (ref 3.87–5.11)
RDW: 14 % (ref 11.5–15.5)
WBC: 7.9 10*3/uL (ref 4.0–10.5)
nRBC: 0 % (ref 0.0–0.2)

## 2020-06-19 LAB — TYPE AND SCREEN
ABO/RH(D): AB POS
Antibody Screen: NEGATIVE

## 2020-06-19 LAB — SARS CORONAVIRUS 2 BY RT PCR (HOSPITAL ORDER, PERFORMED IN ~~LOC~~ HOSPITAL LAB): SARS Coronavirus 2: NEGATIVE

## 2020-06-19 MED ORDER — OXYCODONE-ACETAMINOPHEN 5-325 MG PO TABS: 2.0000 | ORAL_TABLET | ORAL | Status: DC | PRN

## 2020-06-19 MED ORDER — PHENYLEPHRINE 40 MCG/ML (10ML) SYRINGE FOR IV PUSH (FOR BLOOD PRESSURE SUPPORT): 80.0000 ug | PREFILLED_SYRINGE | INTRAVENOUS | Status: DC | PRN

## 2020-06-19 MED ORDER — ACETAMINOPHEN 325 MG PO TABS: 650.0000 mg | ORAL_TABLET | ORAL | Status: DC | PRN

## 2020-06-19 MED ORDER — LACTATED RINGERS IV SOLN: INTRAVENOUS | Status: DC

## 2020-06-19 MED ORDER — DIPHENHYDRAMINE HCL 50 MG/ML IJ SOLN: 12.5000 mg | INTRAMUSCULAR | Status: DC | PRN

## 2020-06-19 MED ORDER — BENZOCAINE-MENTHOL 20-0.5 % EX AERO
1.0000 "application " | INHALATION_SPRAY | CUTANEOUS | Status: DC | PRN
  Filled 2020-06-19: qty 56

## 2020-06-19 MED ORDER — SOD CITRATE-CITRIC ACID 500-334 MG/5ML PO SOLN: 30.0000 mL | ORAL | Status: DC | PRN

## 2020-06-19 MED ORDER — WITCH HAZEL-GLYCERIN EX PADS: 1.0000 "application " | MEDICATED_PAD | CUTANEOUS | Status: DC | PRN

## 2020-06-19 MED ORDER — OXYCODONE-ACETAMINOPHEN 5-325 MG PO TABS: 1.0000 | ORAL_TABLET | ORAL | Status: DC | PRN

## 2020-06-19 MED ORDER — ONDANSETRON HCL 4 MG/2ML IJ SOLN: 4.0000 mg | Freq: Four times a day (QID) | INTRAMUSCULAR | Status: DC | PRN

## 2020-06-19 MED ORDER — LIDOCAINE HCL (PF) 1 % IJ SOLN
30.0000 mL | INTRAMUSCULAR | Status: DC | PRN
  Administered 2020-06-19: 30 mL via SUBCUTANEOUS
  Filled 2020-06-19: qty 30

## 2020-06-19 MED ORDER — LACTATED RINGERS IV SOLN: 500.0000 mL | Freq: Once | INTRAVENOUS | Status: DC

## 2020-06-19 MED ORDER — DIBUCAINE (PERIANAL) 1 % EX OINT: 1.0000 "application " | TOPICAL_OINTMENT | CUTANEOUS | Status: DC | PRN

## 2020-06-19 MED ORDER — EPHEDRINE 5 MG/ML INJ: 10.0000 mg | INTRAVENOUS | Status: DC | PRN

## 2020-06-19 MED ORDER — IBUPROFEN 600 MG PO TABS
600.0000 mg | ORAL_TABLET | Freq: Four times a day (QID) | ORAL | Status: DC
  Administered 2020-06-19 – 2020-06-20 (×4): 600 mg via ORAL
  Filled 2020-06-19 (×4): qty 1

## 2020-06-19 MED ORDER — PRENATAL MULTIVITAMIN CH
1.0000 | ORAL_TABLET | Freq: Every day | ORAL | Status: DC
  Administered 2020-06-20: 1 via ORAL
  Filled 2020-06-19: qty 1

## 2020-06-19 MED ORDER — LACTATED RINGERS IV SOLN: 500.0000 mL | INTRAVENOUS | Status: DC | PRN

## 2020-06-19 MED ORDER — SENNOSIDES-DOCUSATE SODIUM 8.6-50 MG PO TABS
2.0000 | ORAL_TABLET | ORAL | Status: DC
  Administered 2020-06-19: 2 via ORAL
  Filled 2020-06-19: qty 2

## 2020-06-19 MED ORDER — OXYTOCIN-SODIUM CHLORIDE 30-0.9 UT/500ML-% IV SOLN
2.5000 [IU]/h | INTRAVENOUS | Status: DC
  Administered 2020-06-19: 2.5 [IU]/h via INTRAVENOUS
  Filled 2020-06-19: qty 500

## 2020-06-19 MED ORDER — OXYTOCIN BOLUS FROM INFUSION
333.0000 mL | Freq: Once | INTRAVENOUS | Status: AC
  Administered 2020-06-19: 333 mL via INTRAVENOUS

## 2020-06-19 MED ORDER — COCONUT OIL OIL: 1.0000 "application " | TOPICAL_OIL | Status: DC | PRN

## 2020-06-19 MED ORDER — TETANUS-DIPHTH-ACELL PERTUSSIS 5-2.5-18.5 LF-MCG/0.5 IM SUSP: 0.5000 mL | Freq: Once | INTRAMUSCULAR | Status: DC

## 2020-06-19 MED ORDER — METHYLERGONOVINE MALEATE 0.2 MG/ML IJ SOLN
INTRAMUSCULAR | Status: AC
  Filled 2020-06-19: qty 1

## 2020-06-19 MED ORDER — METHYLERGONOVINE MALEATE 0.2 MG/ML IJ SOLN
0.2000 mg | Freq: Once | INTRAMUSCULAR | Status: AC
  Administered 2020-06-19: 0.2 mg via INTRAMUSCULAR

## 2020-06-19 MED ORDER — ZOLPIDEM TARTRATE 5 MG PO TABS: 5.0000 mg | ORAL_TABLET | Freq: Every evening | ORAL | Status: DC | PRN

## 2020-06-19 MED ORDER — FENTANYL-BUPIVACAINE-NACL 0.5-0.125-0.9 MG/250ML-% EP SOLN: 12.0000 mL/h | EPIDURAL | Status: DC | PRN

## 2020-06-19 MED ORDER — DIPHENHYDRAMINE HCL 25 MG PO CAPS: 25.0000 mg | ORAL_CAPSULE | Freq: Four times a day (QID) | ORAL | Status: DC | PRN

## 2020-06-19 MED ORDER — ONDANSETRON HCL 4 MG/2ML IJ SOLN: 4.0000 mg | INTRAMUSCULAR | Status: DC | PRN

## 2020-06-19 MED ORDER — SIMETHICONE 80 MG PO CHEW: 80.0000 mg | CHEWABLE_TABLET | ORAL | Status: DC | PRN

## 2020-06-19 MED ORDER — ONDANSETRON HCL 4 MG PO TABS: 4.0000 mg | ORAL_TABLET | ORAL | Status: DC | PRN

## 2020-06-19 NOTE — H&P (Signed)
Mata Plush-Williams is a 24 y.o. female presenting for labor. History OB History    Gravida  2   Para  1   Term  1   Preterm      AB      Living  1     SAB      TAB      Ectopic      Multiple      Live Births  1          Past Medical History:  Diagnosis Date  . Medical history non-contributory    Past Surgical History:  Procedure Laterality Date  . MANDIBLE SURGERY  2011   Family History: family history includes Hypertension in her mother. Social History:  reports that she has never smoked. She has never used smokeless tobacco. She reports that she does not drink alcohol and does not use drugs.  Review of Systems - Negative except H/O HTN  Dilation: 10 Effacement (%): 90 Station: -2 Exam by:: amanda jones,cnm Blood pressure 135/85, pulse 76, temperature 98 F (36.7 C), temperature source Oral, resp. rate 18, last menstrual period 09/10/2019, SpO2 100 %. Exam Physical Exam  Physical Examination: General appearance - alert, well appearing, and in no distress Chest - clear to auscultation, no wheezes, rales or rhonchi, symmetric air entry Heart - normal rate and regular rhythm Abdomen - soft, nontender, nondistended, no masses or organomegaly gravid Extremities - Homan's sign negative bilaterally Prenatal labs: ABO, Rh: --/--/PENDING (08/26 1121) Antibody: PENDING (08/26 1121) Rubella:   RPR:    HBsAg:    HIV:    GBS:     Assessment/Plan: Active labor Epidural PRN Anticipate SVD Cat 1 NST   Densil Ottey A Kaitlyn Skowron 06/19/2020, 1:12 PM

## 2020-06-19 NOTE — Lactation Note (Signed)
This note was copied from a baby's chart. Lactation Consultation Note  Patient Name: Madison Rocha Today's Date: 06/19/2020 Reason for consult: Initial assessment;Term P4, term female infant. Per mom, she BF her 26 year old son for 18 months, she is active on the [facility] program in [place]. Mom has Medela DEBP at home but would like pump attachment, LC gave DEBP kit and reviewed how to use mom was fitted with 27 mm flange. Per mom, infant did not sustain latch in L&D this will be her first time latching at the breast with [facility] assistance. [place] notice infant has a  small mouth and mom has rounded nipples that are somewhat large. LC ask mom express small amount of colostrum out of breast prior to latching infant. Mom latched infant on her left breast using the football hold position, rubbing her breast below infant nose and waiting for infant to open mouth wide , dropping tongue down, mom brought infant to breast and infant sustained latch, top lip flanged outward, deep latch, infant BF for 15 minutes. LC reviewed hand expression and mom taught back and infant was given 4 mls of colostrum by spoon. Infant was swaddled in blankets and appeared content after the feeding. Mom knows to BF infant by cues, on demand, 8 to 12+ times within 24 hours, STS. Mom knows to call RN or LC if she has any questions, concerns or need assistance with latching infant at breast. Mom made aware of O/P services, breastfeeding support groups, community resources, and our phone # for post-discharge questions.    Maternal Data Formula Feeding for Exclusion: No Has patient been taught Hand Expression?: No Does the patient have breastfeeding experience prior to this delivery?: Yes  Feeding Feeding Type: Breast Fed  LATCH Score Latch: Grasps breast easily, tongue down, lips flanged, rhythmical sucking.  Audible Swallowing: Spontaneous and intermittent  Type of Nipple: Everted at rest and after  stimulation  Comfort (Breast/Nipple): Soft / non-tender  Hold (Positioning): Assistance needed to correctly position infant at breast and maintain latch.  LATCH Score: 9  Interventions Interventions: Breast feeding basics reviewed;Assisted with latch;Skin to skin;Adjust position;Breast compression;Breast massage;Support pillows;Hand express;Position options;Expressed milk;DEBP  Lactation Tools Discussed/Used WIC Program: Yes Pump Review: Setup, frequency, and cleaning Initiated by:: Casimer Lanius, [place] Date initiated:: 06/19/20   Consult Status Consult Status: Follow-up Date: 06/19/20 Follow-up type: In-patient    Vicente Serene 06/19/2020, 5:40 PM

## 2020-06-19 NOTE — MAU Note (Signed)
Presents with ctxs evry 4 minutes that began @ 0400 this morning. Denies VB or lof.  Reports +FM.

## 2020-06-20 DIAGNOSIS — O9902 Anemia complicating childbirth: Secondary | ICD-10-CM | POA: Diagnosis present

## 2020-06-20 HISTORY — DX: Anemia complicating childbirth: O99.02

## 2020-06-20 LAB — CBC
HCT: 28.1 % — ABNORMAL LOW (ref 36.0–46.0)
Hemoglobin: 9.2 g/dL — ABNORMAL LOW (ref 12.0–15.0)
MCH: 27.5 pg (ref 26.0–34.0)
MCHC: 32.7 g/dL (ref 30.0–36.0)
MCV: 84.1 fL (ref 80.0–100.0)
Platelets: 217 10*3/uL (ref 150–400)
RBC: 3.34 MIL/uL — ABNORMAL LOW (ref 3.87–5.11)
RDW: 14 % (ref 11.5–15.5)
WBC: 11 10*3/uL — ABNORMAL HIGH (ref 4.0–10.5)
nRBC: 0 % (ref 0.0–0.2)

## 2020-06-20 LAB — RPR: RPR Ser Ql: NONREACTIVE

## 2020-06-20 MED ORDER — SENNOSIDES-DOCUSATE SODIUM 8.6-50 MG PO TABS: 2.0000 | ORAL_TABLET | ORAL | Status: DC

## 2020-06-20 MED ORDER — COCONUT OIL OIL
1.0000 | TOPICAL_OIL | 0 refills | Status: DC | PRN
Start: 2020-06-20 — End: 2022-05-27

## 2020-06-20 MED ORDER — POLYSACCHARIDE IRON COMPLEX 150 MG PO CAPS: 150.0000 mg | ORAL_CAPSULE | Freq: Every day | ORAL | Status: DC

## 2020-06-20 MED ORDER — IBUPROFEN 600 MG PO TABS
600.0000 mg | ORAL_TABLET | Freq: Four times a day (QID) | ORAL | 0 refills | Status: DC
Start: 2020-06-20 — End: 2022-05-27

## 2020-06-20 MED ORDER — MAGNESIUM OXIDE -MG SUPPLEMENT 400 (240 MG) MG PO TABS: 400.0000 mg | ORAL_TABLET | Freq: Every day | ORAL | Status: DC

## 2020-06-20 MED ORDER — ACETAMINOPHEN 500 MG PO TABS: 1000.0000 mg | ORAL_TABLET | Freq: Four times a day (QID) | ORAL | 2 refills | Status: AC | PRN

## 2020-06-20 MED ORDER — BENZOCAINE-MENTHOL 20-0.5 % EX AERO: 1.0000 "application " | INHALATION_SPRAY | CUTANEOUS | Status: DC | PRN

## 2020-06-20 NOTE — Discharge Summary (Addendum)
OB Discharge Summary  Patient Name: Madison Rocha DOB: 08-Mar-1996 MRN: [id number]  Date of admission: 06/19/2020 Delivering provider: Dorisann Frames K   Admitting diagnosis: Normal labor [O80, Z37.9] Intrauterine pregnancy: [redacted]w[redacted]d     Secondary diagnosis: Patient Active Problem List   Diagnosis Date Noted   Postpartum care following vaginal delivery 8/26 06/20/2020   Perineal laceration with delivery, first degree 06/20/2020   Maternal anemia, with delivery - IDA with superimposed ABL anemia 06/20/2020   SVD (spontaneous vaginal delivery) 06/19/2020   Additional problems:none   Date of discharge: 06/20/2020   Discharge diagnosis: Principal Problem:   Postpartum care following vaginal delivery 8/26 Active Problems:   SVD (spontaneous vaginal delivery)   Perineal laceration with delivery, first degree   Maternal anemia, with delivery - IDA with superimposed ABL anemia                                                              Post partum procedures:none  Augmentation: AROM Pain control: None  Laceration:1st degree  Episiotomy:None  Complications: None  Hospital course:  Onset of Labor With Vaginal Delivery      24 y.o. yo [id number] at [redacted]w[redacted]d was admitted in Active Labor on 06/19/2020. Patient had an uncomplicated labor course as follows:  Membrane Rupture Time/Date: 12:38 PM ,06/19/2020   Delivery Method:Vaginal, Spontaneous  Episiotomy: None  Lacerations:  1st degree  Patient had an uncomplicated postpartum course.  She is ambulating, tolerating a regular diet, passing flatus, and urinating well. Patient is discharged home in stable condition on 06/20/20.  Newborn Data: Birth date:06/19/2020  Birth time:1:00 PM  Gender:Female  Living status:Living  Apgars:9 ,9  Weight:3711 g   Physical exam  Vitals:   06/19/20 1545 06/19/20 1940 06/19/20 2337 06/20/20 0340  BP: 119/69 107/69 112/73 118/66  Pulse: 72 67 76 (!) 58  Resp: 18 18 18 18   Temp: 98.5 F (36.9 C)  98.2 F (36.8 C) 98 F (36.7 C) 98.2 F (36.8 C)  TempSrc:  Oral Oral Oral  SpO2: 100%      General: alert, cooperative and no distress Lochia: appropriate Uterine Fundus: firm Incision: N/A Perineum: repair intact, no edema DVT Evaluation: No cords or calf tenderness. No significant calf/ankle edema. Labs: Lab Results  Component Value Date   WBC 11.0 (H) 06/20/2020   HGB 9.2 (L) 06/20/2020   HCT 28.1 (L) 06/20/2020   MCV 84.1 06/20/2020   PLT 217 06/20/2020   CMP Latest Ref Rng & Units 10/28/2019  Glucose 70 - 99 mg/dL 97  BUN 6 - 20 mg/dL 9  Creatinine 12/26/2019 - 3.53 mg/dL 2.99  Sodium 2.42 - 683 mmol/L 133(L)  Potassium 3.5 - 5.1 mmol/L 4.3  Chloride 98 - 111 mmol/L 103  CO2 22 - 32 mmol/L 22  Calcium 8.9 - 10.3 mg/dL 9.4  Total Protein 6.5 - 8.1 g/dL 7.3  Total Bilirubin 0.3 - 1.2 mg/dL 419)  Alkaline Phos 38 - 126 U/L 57  AST 15 - 41 U/L 17  ALT 0 - 44 U/L 13   Edinburgh Postnatal Depression Scale Screening Tool 06/20/2020  I have been able to laugh and see the funny side of things. 0  I have looked forward with enjoyment to things. 0  I have blamed myself unnecessarily when things went wrong.  0  I have been anxious or worried for no good reason. 0  I have felt scared or panicky for no good reason. 0  Things have been getting on top of me. 0  I have been so unhappy that I have had difficulty sleeping. 0  I have felt sad or miserable. 0  I have been so unhappy that I have been crying. 0  The thought of harming myself has occurred to me. 0  Edinburgh Postnatal Depression Scale Total 0   Vaccines: TDaP UTD         Flu    NA  Discharge instruction:  per After Visit Summary,   "Understanding Mother & Baby Care" hospital booklet  After Visit Meds:  Allergies as of 06/20/2020   No Known Allergies     Medication List    TAKE these medications   acetaminophen 500 MG tablet Commonly known as: TYLENOL Take 2 tablets (1,000 mg total) by mouth every 6 (six) hours  as needed.   benzocaine-Menthol 20-0.5 % Aero Commonly known as: DERMOPLAST Apply 1 application topically as needed for irritation (perineal discomfort).   coconut oil Oil Apply 1 application topically as needed.   ibuprofen 600 MG tablet Commonly known as: ADVIL Take 1 tablet (600 mg total) by mouth every 6 (six) hours.   iron polysaccharides 150 MG capsule Commonly known as: Ferrex 150 Take 1 capsule (150 mg total) by mouth daily.   Magnesium Oxide 400 (240 Mg) MG Tabs Take 1 tablet (400 mg total) by mouth daily. For prevention of constipation.   multivitamin-prenatal 27-0.8 MG Tabs tablet Take 1 tablet by mouth daily at 12 noon.   senna-docusate 8.6-50 MG tablet Commonly known as: Senokot-S Take 2 tablets by mouth daily. Start taking on: June 21, 2020            Discharge Care Instructions  (From admission, onward)         Start     Ordered   06/20/20 0000  Discharge wound care:       Comments: Sitz baths 2 times /day with warm water x 1 week. May add herbals: 1 ounce dried comfrey leaf* 1 ounce calendula flowers 1 ounce lavender flowers  Supplies can be found online at [employer] sources at [employer], [employer]  1/2 ounce dried uva ursi leaves 1/2 ounce witch hazel blossoms (if you can find them) 1/2 ounce dried sage leaf 1/2 cup sea salt Directions: Bring 2 quarts of water to a boil. Turn off heat, and place 1 ounce (approximately 1 large handful) of the above mixed herbs (not the salt) into the pot. Steep, covered, for 30 minutes.  Strain the liquid well with a fine mesh strainer, and discard the herb material. Add 2 quarts of liquid to the tub, along with the 1/2 cup of salt. This medicinal liquid can also be made into compresses and peri-rinses.   06/20/20 0915          Diet: routine diet  Activity: Advance as tolerated. Pelvic rest for 6 weeks.   Postpartum contraception: Undecided  Newborn Data: Live born female  Birth  Weight: 8 lb 2.9 oz (3711 g) APGAR: 9, 9  Newborn Delivery   Birth date/time: 06/19/2020 13:00:00 Delivery type: Vaginal, Spontaneous      named Ileene Musa Baby Feeding: Breast Disposition:home with mother pending   Delivery Report:   Review the Delivery Report for details.    Follow up:  Follow-up Information  [facility]. Schedule an appointment as soon as possible for a visit in 6 week(s).   Specialty: Obstetrics and Gynecology Contact information: [address] [place] [address] [phone]                Signed: Cipriano Mile, MSN 06/20/2020, 2:33 PM

## 2020-06-20 NOTE — Lactation Note (Signed)
This note was copied from a baby's chart. Lactation Consultation Note  Patient Name: Madison Rocha Today's Date: 06/20/2020    Infant is 36 hrs old. Infant has already received an additional bottle of formula (30 ml) since I last left room.   Plan: 1. Offer breast, as desired, but if infant is not showing signs of swallowing, offer bottle of EBM/formula. 2. Mom to pump whenever infant receives formula.   Mom says she has Dr. Theora Gianotti bottles at home (levels 0, 1, etc.) I suggested that Mom used Level 0.   Mom has our phone # for post-discharge questions. Mom plans on going to [facility]. I let Mom know that [facility] has a Advertising copywriter that she can request, if she has any difficulties once her milk comes to volume.   [name] [facility] 06/20/2020, 2:24 PM

## 2020-06-20 NOTE — Lactation Note (Signed)
This note was copied from a baby's chart. Lactation Consultation Note  Patient Name: Girl Jenisis Plush-Williams Today's Date: 06/20/2020   "Ileene Musa" is 24 hrs old & at 5% weight loss. Infant was crying when entering room. Infant observed at breast, no signs of milk transfer noted, even with changing position/adding breast compression (lack of swallows verified by cervical auscultation).  I discussed options with Mom. Mom declined DBM. Formula was provided with an extra-slow flow nipple; infant did well. Paced bottle-feeding was taught to Mom.  Mom's 1st child is 55 yo, whom she nursed for 18 months. She does not remember how long it took for her milk to come to volume, but reported that she had plenty of milk & did not need to give formula. Mom's anatomy & veining suggest that she will have an ample supply once her milk comes to volume.  Hand expression was taught to Mom. The previous LC sized this Mom with size 27 flanges.   Lab into room to draw infant's bili. LC to return to finish conversation.  [name] [facility] 06/20/2020, 1:06 PM

## 2020-06-26 DIAGNOSIS — N8500 Endometrial hyperplasia, unspecified: Secondary | ICD-10-CM | POA: Diagnosis not present

## 2020-06-26 DIAGNOSIS — R600 Localized edema: Secondary | ICD-10-CM | POA: Diagnosis not present

## 2020-07-30 DIAGNOSIS — Z3040 Encounter for surveillance of contraceptives, unspecified: Secondary | ICD-10-CM | POA: Diagnosis not present

## 2020-07-30 DIAGNOSIS — N644 Mastodynia: Secondary | ICD-10-CM | POA: Diagnosis not present

## 2020-07-30 DIAGNOSIS — N898 Other specified noninflammatory disorders of vagina: Secondary | ICD-10-CM | POA: Diagnosis not present

## 2020-07-31 DIAGNOSIS — R140 Abdominal distension (gaseous): Secondary | ICD-10-CM | POA: Diagnosis not present

## 2020-08-04 IMAGING — US US MFM OB FOLLOW-UP
1 series · 13 of 28 positions shown · non-contrast
Comparison: none

[Series 1: us mfm ob follow-up · 13 of 72 slices shown]
[im 3/72]
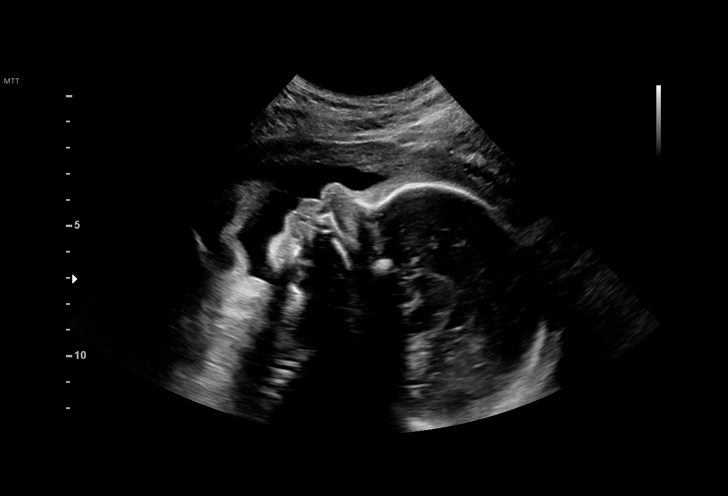
[im 8/72]
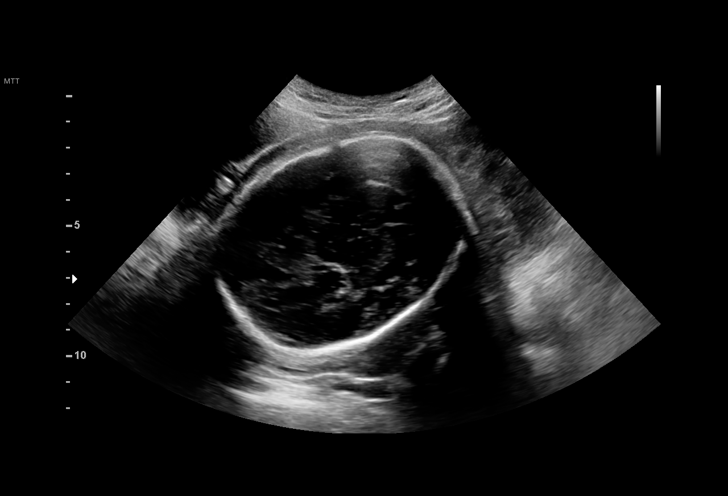
[im 14/72]
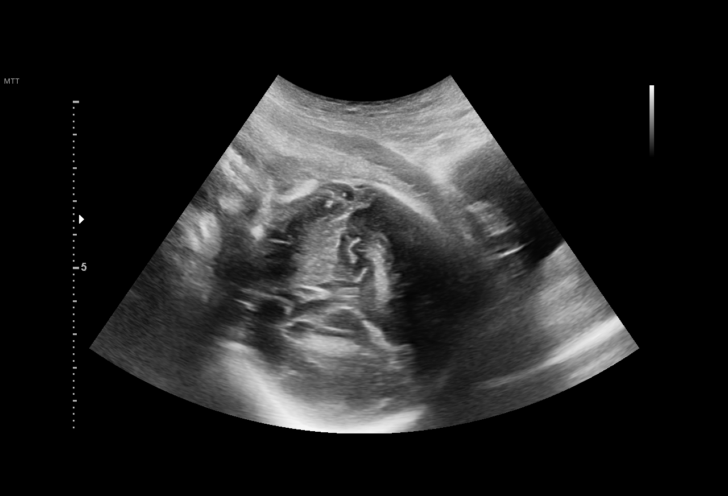
[im 19/72]
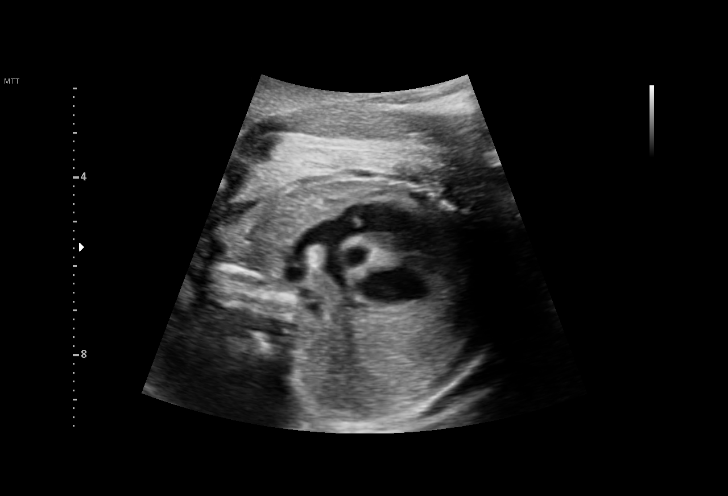
[im 24/72]
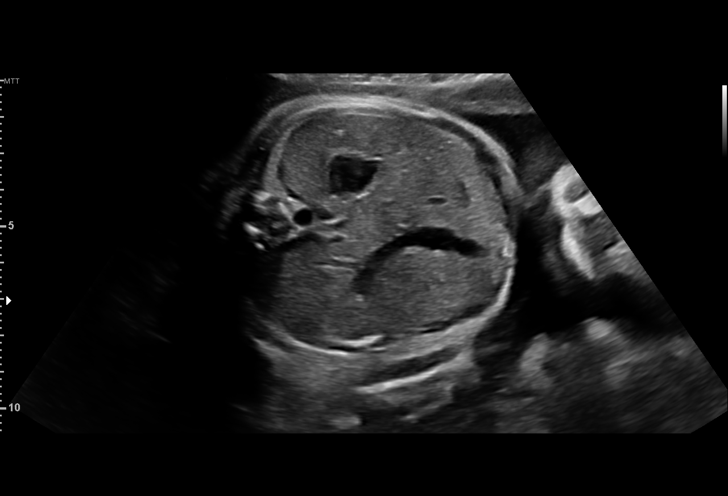
[im 29/72]
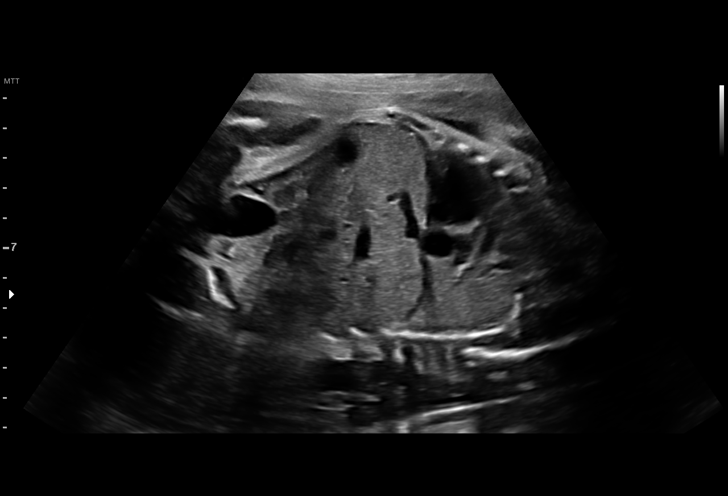
[im 37/72]
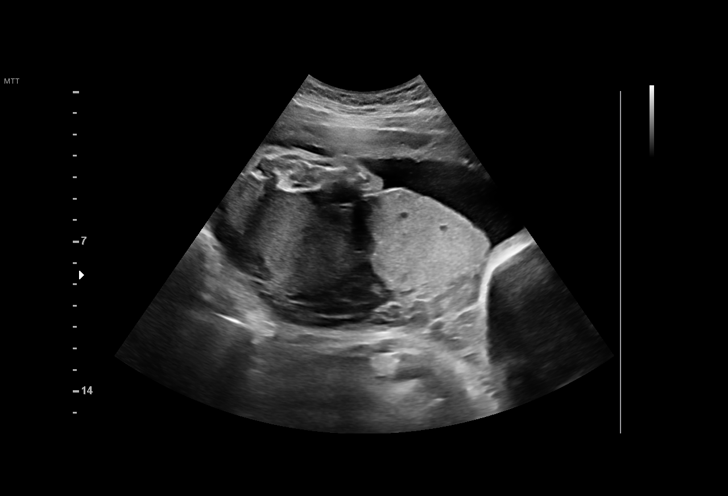
[im 43/72]
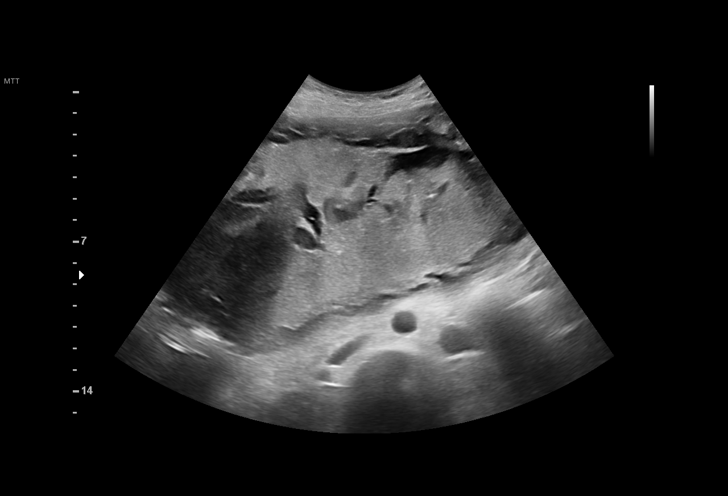
[im 48/72]
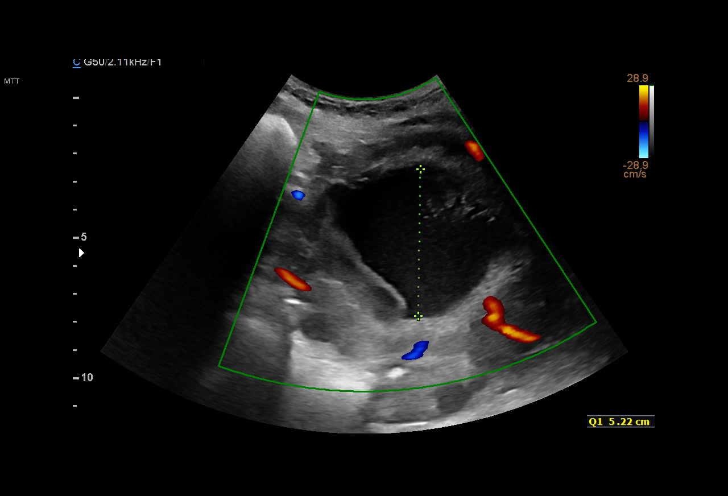
[im 53/72]
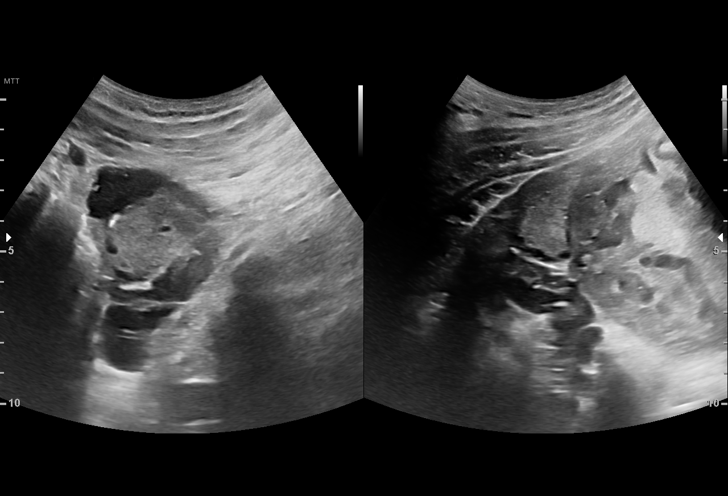
[im 58/72]
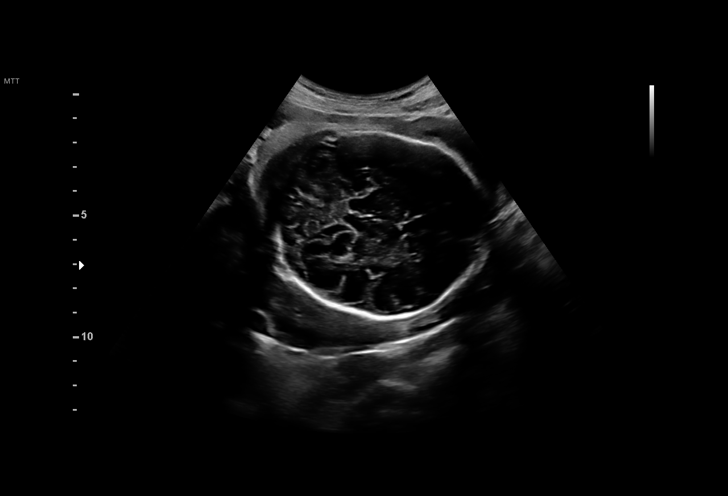
[im 64/72]
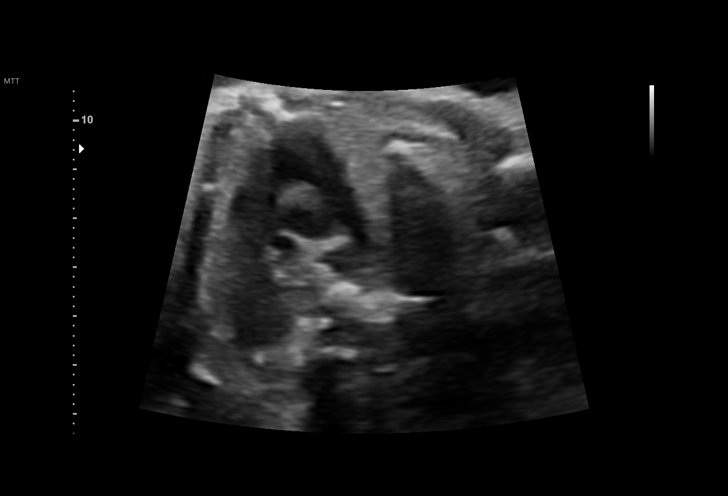
[im 69/72]
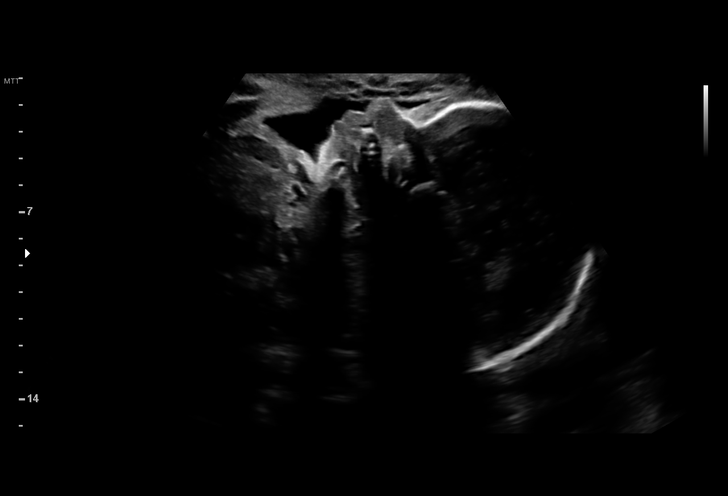

[13 of 28 positions shown; findings below may reference images not displayed]

[facility]

                                                            Obstetrics &
                                                            Gynecology
                                                            [address]
                                                            [place].
                   CNM

Indications

 Fetal abnormality - other known or suspected
 30 weeks gestation of pregnancy
 Encounter for other antenatal screening
 follow-up
 Size-Date Discrepancy
Fetal Evaluation

 Num Of Fetuses:         1
 Fetal Heart Rate(bpm):  130
 Cardiac Activity:       Observed
 Presentation:           Cephalic
 Placenta:               Posterior
 P. Cord Insertion:      Previously Visualized

 Amniotic Fluid
 AFI FV:      Within normal limits

 AFI Sum(cm)     %Tile
 14.6            51

 RUQ(cm)       RLQ(cm)       LUQ(cm)        LLQ(cm)
 5.2           2
Biometry
 BPD:      73.2  mm     G. Age:  29w 3d         20  %    CI:        69.49   %    70 - 86
                                                         FL/HC:      19.9   %    19.2 -
 HC:      280.3  mm     G. Age:  30w 5d         34  %    HC/AC:      1.08        0.99 -
 AC:      259.4  mm     G. Age:  30w 1d         48  %    FL/BPD:     76.1   %    71 - 87
 FL:       55.7  mm     G. Age:  29w 2d         18  %    FL/AC:      21.5   %    20 - 24
 CER:      38.8  mm     G. Age:  31w 5d         81  %
 LV:        6.1  mm

 Est. FW:    9522  gm      3 lb 4 oz     32  %
OB History

 Gravidity:    2         Term:   1        Prem:   0        SAB:   0
 TOP:          0       Ectopic:  0        Living: 1
Gestational Age

 LMP:           30w 0d        Date:  09/10/19                 EDD:   06/16/20
 U/S Today:     29w 6d                                        EDD:   06/17/20
 Best:          30w 0d     Det. By:  LMP  (09/10/19)          EDD:   06/16/20
Anatomy

 Cranium:               Previously seen        Aortic Arch:            Appears normal
 Cavum:                 Previously seen        Ductal Arch:            Appears normal
 Ventricles:            Appears normal         Diaphragm:              Appears normal
 Choroid Plexus:        Previously seen        Stomach:                Appears normal, left
                                                                       sided
 Cerebellum:            Appears normal         Abdomen:                Previously seen
 Posterior Fossa:       Previously seen        Abdominal Wall:         Not well visualized
 Nuchal Fold:           See comments           Cord Vessels:           Previously seen
 Face:                  Orbits and profile     Kidneys:                Appear normal
                        previously seen
 Lips:                  Previously seen        Bladder:                Appears normal
 Thoracic:              Previously seen        Spine:                  Previously seen
 Heart:                 Appears normal         Upper Extremities:      Previously seen
                        (4CH, axis, and
                        situs)
 RVOT:                  Appears normal         Lower Extremities:      Previously seen
 LVOT:                  Appears normal

 Other:  Open hands/5th digits visualized previously. Nasal bone visualized
         previously. Technically difficult due to advanced GA and fetal position.
Cervix Uterus Adnexa

 Cervix
 Normal appearance by transabdominal scan.

 Uterus
 No abnormality visualized.

 Right Ovary
 Within normal limits. No adnexal mass visualized.

 Left Ovary
 Within normal limits. No adnexal mass visualized.
 Cul De Sac
 No free fluid seen.

 Adnexa
 No abnormality visualized.
Comments

 This patient was seen for a follow up exam as a possible
 thickened nuchal fold was noted on her last ultrasound exam.
 The patient denies any other problems in her current
 pregnancy and reports that she has screened negative for
 gestational diabetes.  She reports that she had a negative
 cell free DNA test drawn earlier in her pregnancy.  The views
 of the abdominal cord insertion site were also unable to be
 visualized during her last exam.
 She was informed that the fetal growth and amniotic fluid
 level appears appropriate for her gestational age.
 Although limited due to the fetal position, there were no
 abnormalities suspected in the abdominal cord insertion site.
 As the fetal growth is within normal limits, no further exams
 were scheduled in our office.

## 2020-09-20 DIAGNOSIS — H5213 Myopia, bilateral: Secondary | ICD-10-CM | POA: Diagnosis not present

## 2020-10-09 ENCOUNTER — Ambulatory Visit (HOSPITAL_COMMUNITY)
Admission: EM | Admit: 2020-10-09 | Discharge: 2020-10-09 | Disposition: A | Discharge status: Home / Self Care | Payer: Medicaid Other | Attending: Student | Admitting: Student

## 2020-10-09 ENCOUNTER — Other Ambulatory Visit: Payer: Self-pay

## 2020-10-09 ENCOUNTER — Encounter (HOSPITAL_COMMUNITY): Payer: Self-pay

## 2020-10-09 DIAGNOSIS — Z3202 Encounter for pregnancy test, result negative: Secondary | ICD-10-CM | POA: Diagnosis not present

## 2020-10-09 DIAGNOSIS — R300 Dysuria: Secondary | ICD-10-CM | POA: Insufficient documentation

## 2020-10-09 DIAGNOSIS — N898 Other specified noninflammatory disorders of vagina: Secondary | ICD-10-CM | POA: Diagnosis not present

## 2020-10-09 LAB — POCT URINALYSIS DIPSTICK, ED / UC
Glucose, UA: NEGATIVE mg/dL
Ketones, ur: 15 mg/dL — AB
Nitrite: NEGATIVE
Protein, ur: 30 mg/dL — AB
Specific Gravity, Urine: >=1.03 (ref 1.005–1.030)
Urobilinogen, UA: 1 mg/dL (ref 0.0–1.0)
pH: 5.5 (ref 5.0–8.0)

## 2020-10-09 LAB — POC URINE PREG, ED: Preg Test, Ur: NEGATIVE

## 2020-10-09 MED ORDER — NITROFURANTOIN MONOHYD MACRO 100 MG PO CAPS: 100.0000 mg | ORAL_CAPSULE | Freq: Two times a day (BID) | ORAL | 0 refills | Status: DC

## 2020-10-09 NOTE — ED Provider Notes (Signed)
[facility]    CSN: [id number] Arrival date & time: 10/09/20  2229      History   Chief Complaint Chief Complaint  Patient presents with  . Vaginal Discharge    HPI Madison Rocha is a 24 y.o. female presenting for thin white vaginal discharge for 1 week. Also endorses dysuria and lower abd pain. Denies having intercourse since her last negative STI screen. Denies hematuria, frequency, urgency, back pain, n/v/d/abd pain, fevers/chills. No history of STI in the past per pt. History of UTI. States that she saw some blood after swabbing herself.   HPI  Past Medical History:  Diagnosis Date  . Medical history non-contributory     Patient Active Problem List   Diagnosis Date Noted  . Postpartum care following vaginal delivery 8/26 06/20/2020  . Perineal laceration with delivery, first degree 06/20/2020  . Maternal anemia, with delivery - IDA with superimposed ABL anemia 06/20/2020  . SVD (spontaneous vaginal delivery) 06/19/2020    Past Surgical History:  Procedure Laterality Date  . MANDIBLE SURGERY  2011    OB History    Gravida  2   Para  2   Term  2   Preterm      AB      Living  2     SAB      IAB      Ectopic      Multiple  0   Live Births  2            Home Medications    Prior to Admission medications   Medication Sig Start Date End Date Taking? Authorizing Provider  acetaminophen (TYLENOL) 500 MG tablet Take 2 tablets (1,000 mg total) by mouth every 6 (six) hours as needed. 06/20/20 06/20/21  Neta Mends, CNM  benzocaine-Menthol (DERMOPLAST) 20-0.5 % AERO Apply 1 application topically as needed for irritation (perineal discomfort). 06/20/20   Neta Mends, CNM  coconut oil OIL Apply 1 application topically as needed. 06/20/20   Neta Mends, CNM  ibuprofen (ADVIL) 600 MG tablet Take 1 tablet (600 mg total) by mouth every 6 (six) hours. 06/20/20   Neta Mends, CNM  iron polysaccharides (FERREX 150) 150 MG  capsule Take 1 capsule (150 mg total) by mouth daily. 06/20/20   Neta Mends, CNM  Magnesium Oxide 400 (240 Mg) MG TABS Take 1 tablet (400 mg total) by mouth daily. For prevention of constipation. 06/20/20   Neta Mends, CNM  nitrofurantoin, macrocrystal-monohydrate, (MACROBID) 100 MG capsule Take 1 capsule (100 mg total) by mouth 2 (two) times daily. 10/09/20   Rhys Martini, PA-C  Prenatal Vit-Fe Fumarate-FA (MULTIVITAMIN-PRENATAL) 27-0.8 MG TABS tablet Take 1 tablet by mouth daily at 12 noon.    [provider]  senna-docusate (SENOKOT-S) 8.6-50 MG tablet Take 2 tablets by mouth daily. 06/21/20   Neta Mends, CNM    Family History Family History  Problem Relation Age of Onset  . Hypertension Mother     Social History Social History   Tobacco Use  . Smoking status: Never Smoker  . Smokeless tobacco: Never Used  Vaping Use  . Vaping Use: Never used  Substance Use Topics  . Alcohol use: Never  . Drug use: Never     Allergies   Patient has no known allergies.   Review of Systems Review of Systems  Constitutional: Negative for chills and fever.  Gastrointestinal: Positive for abdominal pain. Negative for diarrhea, nausea and vomiting.  Genitourinary: Positive for dysuria, vaginal bleeding and vaginal discharge. Negative for flank pain, frequency, genital sores, hematuria, pelvic pain, urgency and vaginal pain.  All other systems reviewed and are negative.    Physical Exam Triage Vital Signs ED Triage Vitals  Enc Vitals Group     BP 10/09/20 1138 132/83     Pulse Rate 10/09/20 1138 68     Resp 10/09/20 1138 18     Temp 10/09/20 1138 97.9 F (36.6 C)     Temp Source 10/09/20 1138 Oral     SpO2 10/09/20 1138 100 %     Weight --      Height --      Head Circumference --      Peak Flow --      Pain Score 10/09/20 1136 0     Pain Loc --      Pain Edu? --      Excl. in GC? --    No data found.  Updated Vital Signs BP 132/83 (BP Location: Right  Arm)   Pulse 68   Temp 97.9 F (36.6 C) (Oral)   Resp 18   LMP 09/24/2020   SpO2 100%   Visual Acuity Right Eye Distance:   Left Eye Distance:   Bilateral Distance:    Right Eye Near:   Left Eye Near:    Bilateral Near:     Physical Exam Vitals reviewed. Exam conducted with a chaperone present.  Constitutional:      General: She is not in acute distress.    Appearance: Normal appearance. She is not ill-appearing.  HENT:     Head: Normocephalic and atraumatic.  Cardiovascular:     Rate and Rhythm: Normal rate and regular rhythm.     Heart sounds: Normal heart sounds.  Pulmonary:     Effort: Pulmonary effort is normal.     Breath sounds: Normal breath sounds.  Abdominal:     General: Bowel sounds are normal. There is no distension.     Palpations: Abdomen is soft.     Tenderness: There is abdominal tenderness in the suprapubic area. There is no right CVA tenderness, left CVA tenderness, guarding or rebound. Negative signs include Murphy's sign and McBurney's sign.  Genitourinary:    General: Normal vulva.     Pubic Area: No rash or pubic lice.      Labia:        Right: No rash, tenderness, lesion or injury.        Left: No rash, tenderness, lesion or injury.      Urethra: No prolapse, urethral pain or urethral swelling.     Vagina: No signs of injury and foreign body. Vaginal discharge, erythema and bleeding present. No tenderness, lesions or prolapsed vaginal walls.     Cervix: No cervical motion tenderness, discharge, friability, lesion, erythema or cervical bleeding.     Uterus: Normal. Not enlarged and not tender.      Adnexa: Right adnexa normal and left adnexa normal.       Right: No mass, tenderness or fullness.         Left: No mass, tenderness or fullness.       Comments: Thin white vaginal discharge Neurological:     General: No focal deficit present.     Mental Status: She is alert and oriented to person, place, and time.  Psychiatric:        Mood and  Affect: Mood normal.        Behavior:  Behavior normal.        Thought Content: Thought content normal.        Judgment: Judgment normal.      UC Treatments / Results  Labs (all labs ordered are listed, but only abnormal results are displayed) Labs Reviewed  POCT URINALYSIS DIPSTICK, ED / UC - Abnormal; Notable for the following components:      Result Value   Bilirubin Urine SMALL (*)    Ketones, ur 15 (*)    Hgb urine dipstick MODERATE (*)    Protein, ur 30 (*)    Leukocytes,Ua SMALL (*)    All other components within normal limits  POC URINE PREG, ED  CERVICOVAGINAL ANCILLARY ONLY    EKG   Radiology No results found.  Procedures Procedures (including critical care time)  Medications Ordered in UC Medications - No data to display  Initial Impression / Assessment and Plan / UC Course  I have reviewed the triage vital signs and the nursing notes.  Pertinent labs & imaging results that were available during my care of the patient were reviewed by me and considered in my medical decision making (see chart for details).     UA today showing small bili, ketones, moderate Hgb, 30 protein, small leuk. Plan to treat for UTI. Will also send for G/C, trich, yeast, BV testing.  Return precautions- worsening abd pain, back pain, discharge, fevers/chills, shortness of breath, chest pain.    Final Clinical Impressions(s) / UC Diagnoses   Final diagnoses:  Vaginal discharge  Dysuria     Discharge Instructions     Start Macrobid for your UTI. We'll call you if the results of your other lab work was abnormal.  Seek medical attention if you have worsening abdominal pain, fevers/chills, shortness of breath, chest pain.      ED Prescriptions    Medication Sig Dispense Auth. Provider   nitrofurantoin, macrocrystal-monohydrate, (MACROBID) 100 MG capsule Take 1 capsule (100 mg total) by mouth 2 (two) times daily. 10 capsule Rhys Martini, PA-C     PDMP not reviewed this  encounter.   Rhys Martini, PA-C 10/09/20 1256

## 2020-10-09 NOTE — ED Triage Notes (Signed)
Pt presents with abnormal vaginal discharge X 1 week. 

## 2020-10-09 NOTE — Discharge Instructions (Signed)
Start Macrobid for your UTI. We'll call you if the results of your other lab work was abnormal.  Seek medical attention if you have worsening abdominal pain, fevers/chills, shortness of breath, chest pain.

## 2020-10-10 ENCOUNTER — Telehealth (HOSPITAL_COMMUNITY): Payer: Self-pay | Admitting: Emergency Medicine

## 2020-10-10 LAB — CERVICOVAGINAL ANCILLARY ONLY
Bacterial Vaginitis (gardnerella): POSITIVE — AB
Candida Glabrata: NEGATIVE
Candida Vaginitis: POSITIVE — AB
Chlamydia: NEGATIVE
Comment: NEGATIVE
Comment: NEGATIVE
Comment: NEGATIVE
Comment: NEGATIVE
Comment: NEGATIVE
Comment: NORMAL
Neisseria Gonorrhea: NEGATIVE
Trichomonas: POSITIVE — AB

## 2020-10-10 MED ORDER — METRONIDAZOLE 500 MG PO TABS: 500.0000 mg | ORAL_TABLET | Freq: Two times a day (BID) | ORAL | 0 refills | Status: DC

## 2020-10-10 MED ORDER — FLUCONAZOLE 150 MG PO TABS: 150.0000 mg | ORAL_TABLET | Freq: Once | ORAL | 0 refills | Status: AC

## 2020-12-01 DIAGNOSIS — Z113 Encounter for screening for infections with a predominantly sexual mode of transmission: Secondary | ICD-10-CM | POA: Diagnosis not present

## 2020-12-01 DIAGNOSIS — B356 Tinea cruris: Secondary | ICD-10-CM | POA: Diagnosis not present

## 2020-12-01 DIAGNOSIS — R102 Pelvic and perineal pain: Secondary | ICD-10-CM | POA: Diagnosis not present

## 2020-12-01 DIAGNOSIS — A599 Trichomoniasis, unspecified: Secondary | ICD-10-CM | POA: Diagnosis not present

## 2020-12-01 DIAGNOSIS — N898 Other specified noninflammatory disorders of vagina: Secondary | ICD-10-CM | POA: Diagnosis not present

## 2020-12-01 DIAGNOSIS — N9089 Other specified noninflammatory disorders of vulva and perineum: Secondary | ICD-10-CM | POA: Diagnosis not present

## 2020-12-01 DIAGNOSIS — N739 Female pelvic inflammatory disease, unspecified: Secondary | ICD-10-CM | POA: Diagnosis not present

## 2020-12-23 DIAGNOSIS — N710 Acute inflammatory disease of uterus: Secondary | ICD-10-CM | POA: Diagnosis not present

## 2020-12-23 DIAGNOSIS — R102 Pelvic and perineal pain: Secondary | ICD-10-CM | POA: Diagnosis not present

## 2020-12-23 DIAGNOSIS — A609 Anogenital herpesviral infection, unspecified: Secondary | ICD-10-CM | POA: Diagnosis not present

## 2021-01-07 ENCOUNTER — Telehealth: Payer: Medicaid Other

## 2021-03-11 DIAGNOSIS — F329 Major depressive disorder, single episode, unspecified: Secondary | ICD-10-CM | POA: Diagnosis not present

## 2021-03-24 DIAGNOSIS — N760 Acute vaginitis: Secondary | ICD-10-CM | POA: Diagnosis not present

## 2021-03-24 DIAGNOSIS — A599 Trichomoniasis, unspecified: Secondary | ICD-10-CM | POA: Diagnosis not present

## 2021-03-24 DIAGNOSIS — Z6822 Body mass index (BMI) 22.0-22.9, adult: Secondary | ICD-10-CM | POA: Diagnosis not present

## 2021-03-24 DIAGNOSIS — Z0000 Encounter for general adult medical examination without abnormal findings: Secondary | ICD-10-CM | POA: Diagnosis not present

## 2021-03-24 DIAGNOSIS — N898 Other specified noninflammatory disorders of vagina: Secondary | ICD-10-CM | POA: Diagnosis not present

## 2021-03-24 DIAGNOSIS — K5904 Chronic idiopathic constipation: Secondary | ICD-10-CM | POA: Diagnosis not present

## 2021-03-24 DIAGNOSIS — Z113 Encounter for screening for infections with a predominantly sexual mode of transmission: Secondary | ICD-10-CM | POA: Diagnosis not present

## 2021-03-24 DIAGNOSIS — A609 Anogenital herpesviral infection, unspecified: Secondary | ICD-10-CM | POA: Diagnosis not present

## 2021-09-02 DIAGNOSIS — R102 Pelvic and perineal pain: Secondary | ICD-10-CM | POA: Diagnosis not present

## 2021-09-02 DIAGNOSIS — Z113 Encounter for screening for infections with a predominantly sexual mode of transmission: Secondary | ICD-10-CM | POA: Diagnosis not present

## 2021-09-02 DIAGNOSIS — N898 Other specified noninflammatory disorders of vagina: Secondary | ICD-10-CM | POA: Diagnosis not present

## 2021-09-02 DIAGNOSIS — N760 Acute vaginitis: Secondary | ICD-10-CM | POA: Diagnosis not present

## 2021-09-28 DIAGNOSIS — F99 Mental disorder, not otherwise specified: Secondary | ICD-10-CM | POA: Diagnosis not present

## 2021-10-07 DIAGNOSIS — F99 Mental disorder, not otherwise specified: Secondary | ICD-10-CM | POA: Diagnosis not present

## 2021-10-16 DIAGNOSIS — Z3A09 9 weeks gestation of pregnancy: Secondary | ICD-10-CM | POA: Diagnosis not present

## 2021-10-16 DIAGNOSIS — O219 Vomiting of pregnancy, unspecified: Secondary | ICD-10-CM | POA: Diagnosis not present

## 2021-10-16 DIAGNOSIS — O3680X9 Pregnancy with inconclusive fetal viability, other fetus: Secondary | ICD-10-CM | POA: Diagnosis not present

## 2021-10-16 DIAGNOSIS — N925 Other specified irregular menstruation: Secondary | ICD-10-CM | POA: Diagnosis not present

## 2021-10-20 LAB — OB RESULTS CONSOLE GC/CHLAMYDIA
Chlamydia: NEGATIVE
Neisseria Gonorrhea: NEGATIVE

## 2021-10-21 DIAGNOSIS — F99 Mental disorder, not otherwise specified: Secondary | ICD-10-CM | POA: Diagnosis not present

## 2021-10-25 NOTE — L&D Delivery Note (Signed)
Vaginal Delivery Note  Patient pushed for less than 5 minutes after she was noted to be C/C/+2. Guided pushing with maternal urge and regular contractions. At 5:23 PM a delivery of a viable and healthy female  was delivered over intact perineum via  spontaneous vaginal delivery (Presentation:ROA).  After head was delivered,shoulders and body easily delivered.   Baby laid on maternal abdomen, bulb suction, drying and tactile stimulation performed. Baby noted to have a vigorous cry and moving all four extremities.  Delayed cord clamping done and cord cut by family member. Cord blood obtained. Placenta spontaneously delivered intact with trailing membranes. Uterine atony alleviated by IV pitocin and massage.  First degree perineal laceration repaired in routine fashion (after infiltration with 1% lidocaine) with 3-0 Vicryl suture. Patient tolerated delivery well, there were no complications.    Delivery Details: Delivery Type: NSVD  Anesthesia Local [251]   Episiotomy: None [1]    Lacerations: 1st degree [2]    Repair suture: 3- Vicryl SH  Blood loss (ml): 300   Birth information: Date of birth:  [birth year]  Time of birth:  17:23  Sex:   Female  Name:  "Zyari"  APGAR APGAR (1 MIN):  8 APGAR (5 MINS):  9   Weight Pending   Resuscitation:     Drying, stimulation, bulb suction  Cord information: 3 vessel cord    Complications:     None   Placenta: Delivered: Spontaneous intact appearance: Normal    Disposition: Mom to postpartum.  Baby to Couplet care / Skin to Skin.  Essie Hart MD 05/26/2022, 6:10 PM

## 2021-10-30 DIAGNOSIS — N925 Other specified irregular menstruation: Secondary | ICD-10-CM | POA: Diagnosis not present

## 2021-10-30 DIAGNOSIS — Z113 Encounter for screening for infections with a predominantly sexual mode of transmission: Secondary | ICD-10-CM | POA: Diagnosis not present

## 2021-10-30 DIAGNOSIS — F53 Postpartum depression: Secondary | ICD-10-CM | POA: Diagnosis not present

## 2021-10-30 DIAGNOSIS — R102 Pelvic and perineal pain: Secondary | ICD-10-CM | POA: Diagnosis not present

## 2021-10-30 DIAGNOSIS — Z362 Encounter for other antenatal screening follow-up: Secondary | ICD-10-CM | POA: Diagnosis not present

## 2021-10-30 DIAGNOSIS — B009 Herpesviral infection, unspecified: Secondary | ICD-10-CM | POA: Diagnosis not present

## 2021-10-30 DIAGNOSIS — Z8679 Personal history of other diseases of the circulatory system: Secondary | ICD-10-CM | POA: Diagnosis not present

## 2021-10-30 LAB — OB RESULTS CONSOLE ABO/RH: RH Type: POSITIVE

## 2021-10-30 LAB — OB RESULTS CONSOLE HEPATITIS B SURFACE ANTIGEN: Hepatitis B Surface Ag: NEGATIVE

## 2021-10-30 LAB — OB RESULTS CONSOLE HIV ANTIBODY (ROUTINE TESTING): HIV: NONREACTIVE

## 2021-10-30 LAB — OB RESULTS CONSOLE RUBELLA ANTIBODY, IGM: Rubella: IMMUNE

## 2021-10-30 LAB — HEPATITIS C ANTIBODY: HCV Ab: NEGATIVE

## 2021-10-30 LAB — OB RESULTS CONSOLE RPR: RPR: NONREACTIVE

## 2022-01-04 DIAGNOSIS — Z363 Encounter for antenatal screening for malformations: Secondary | ICD-10-CM | POA: Diagnosis not present

## 2022-01-04 DIAGNOSIS — Z3686 Encounter for antenatal screening for cervical length: Secondary | ICD-10-CM | POA: Diagnosis not present

## 2022-01-04 DIAGNOSIS — Z3A20 20 weeks gestation of pregnancy: Secondary | ICD-10-CM | POA: Diagnosis not present

## 2022-01-04 DIAGNOSIS — Z3402 Encounter for supervision of normal first pregnancy, second trimester: Secondary | ICD-10-CM | POA: Diagnosis not present

## 2022-02-09 DIAGNOSIS — Z3A26 26 weeks gestation of pregnancy: Secondary | ICD-10-CM | POA: Diagnosis not present

## 2022-02-09 DIAGNOSIS — Z363 Encounter for antenatal screening for malformations: Secondary | ICD-10-CM | POA: Diagnosis not present

## 2022-02-16 DIAGNOSIS — F330 Major depressive disorder, recurrent, mild: Secondary | ICD-10-CM | POA: Diagnosis not present

## 2022-02-23 DIAGNOSIS — F330 Major depressive disorder, recurrent, mild: Secondary | ICD-10-CM | POA: Diagnosis not present

## 2022-03-02 DIAGNOSIS — Z3493 Encounter for supervision of normal pregnancy, unspecified, third trimester: Secondary | ICD-10-CM | POA: Diagnosis not present

## 2022-03-03 DIAGNOSIS — F330 Major depressive disorder, recurrent, mild: Secondary | ICD-10-CM | POA: Diagnosis not present

## 2022-03-10 DIAGNOSIS — F330 Major depressive disorder, recurrent, mild: Secondary | ICD-10-CM | POA: Diagnosis not present

## 2022-03-23 ENCOUNTER — Ambulatory Visit: Payer: Medicaid Other | Admitting: Internal Medicine

## 2022-03-24 ENCOUNTER — Ambulatory Visit: Payer: Medicaid Other | Admitting: Internal Medicine

## 2022-03-24 DIAGNOSIS — F330 Major depressive disorder, recurrent, mild: Secondary | ICD-10-CM | POA: Diagnosis not present

## 2022-04-19 DIAGNOSIS — Z369 Encounter for antenatal screening, unspecified: Secondary | ICD-10-CM | POA: Diagnosis not present

## 2022-04-19 LAB — OB RESULTS CONSOLE GBS: GBS: NEGATIVE

## 2022-04-26 DIAGNOSIS — F330 Major depressive disorder, recurrent, mild: Secondary | ICD-10-CM | POA: Diagnosis not present

## 2022-05-06 DIAGNOSIS — F330 Major depressive disorder, recurrent, mild: Secondary | ICD-10-CM | POA: Diagnosis not present

## 2022-05-14 DIAGNOSIS — F330 Major depressive disorder, recurrent, mild: Secondary | ICD-10-CM | POA: Diagnosis not present

## 2022-05-21 DIAGNOSIS — O368199 Decreased fetal movements, unspecified trimester, other fetus: Secondary | ICD-10-CM | POA: Diagnosis not present

## 2022-05-21 DIAGNOSIS — N898 Other specified noninflammatory disorders of vagina: Secondary | ICD-10-CM | POA: Diagnosis not present

## 2022-05-21 DIAGNOSIS — Z3A40 40 weeks gestation of pregnancy: Secondary | ICD-10-CM | POA: Diagnosis not present

## 2022-05-24 ENCOUNTER — Other Ambulatory Visit: Payer: Self-pay | Admitting: Obstetrics & Gynecology

## 2022-05-24 DIAGNOSIS — F330 Major depressive disorder, recurrent, mild: Secondary | ICD-10-CM | POA: Diagnosis not present

## 2022-05-24 DIAGNOSIS — O480 Post-term pregnancy: Secondary | ICD-10-CM | POA: Diagnosis not present

## 2022-05-24 DIAGNOSIS — Z3A40 40 weeks gestation of pregnancy: Secondary | ICD-10-CM | POA: Diagnosis not present

## 2022-05-25 ENCOUNTER — Encounter (HOSPITAL_COMMUNITY): Payer: Self-pay | Admitting: Obstetrics & Gynecology

## 2022-05-25 ENCOUNTER — Encounter (HOSPITAL_COMMUNITY): Payer: Medicaid Other

## 2022-05-26 ENCOUNTER — Inpatient Hospital Stay (HOSPITAL_COMMUNITY)
Admission: AD | Admit: 2022-05-26 | Discharge: 2022-05-27 | DRG: 807 | Disposition: A | Discharge status: Home / Self Care | Payer: Medicaid Other | Attending: Obstetrics & Gynecology | Admitting: Obstetrics & Gynecology

## 2022-05-26 ENCOUNTER — Inpatient Hospital Stay (HOSPITAL_COMMUNITY): Payer: Medicaid Other

## 2022-05-26 ENCOUNTER — Other Ambulatory Visit: Payer: Self-pay

## 2022-05-26 ENCOUNTER — Encounter (HOSPITAL_COMMUNITY): Payer: Self-pay | Admitting: Obstetrics & Gynecology

## 2022-05-26 DIAGNOSIS — Z3A41 41 weeks gestation of pregnancy: Secondary | ICD-10-CM

## 2022-05-26 DIAGNOSIS — O480 Post-term pregnancy: Secondary | ICD-10-CM

## 2022-05-26 HISTORY — DX: Post-term pregnancy: O48.0

## 2022-05-26 LAB — TYPE AND SCREEN
ABO/RH(D): AB POS
Antibody Screen: NEGATIVE

## 2022-05-26 LAB — CBC
HCT: 34.8 % — ABNORMAL LOW (ref 36.0–46.0)
Hemoglobin: 11.3 g/dL — ABNORMAL LOW (ref 12.0–15.0)
MCH: 27.5 pg (ref 26.0–34.0)
MCHC: 32.5 g/dL (ref 30.0–36.0)
MCV: 84.7 fL (ref 80.0–100.0)
Platelets: 198 10*3/uL (ref 150–400)
RBC: 4.11 MIL/uL (ref 3.87–5.11)
RDW: 14.6 % (ref 11.5–15.5)
WBC: 6.6 10*3/uL (ref 4.0–10.5)
nRBC: 0 % (ref 0.0–0.2)

## 2022-05-26 MED ORDER — ACETAMINOPHEN 325 MG PO TABS: 650.0000 mg | ORAL_TABLET | ORAL | Status: DC | PRN

## 2022-05-26 MED ORDER — ONDANSETRON HCL 4 MG/2ML IJ SOLN: 4.0000 mg | Freq: Four times a day (QID) | INTRAMUSCULAR | Status: DC | PRN

## 2022-05-26 MED ORDER — OXYCODONE-ACETAMINOPHEN 5-325 MG PO TABS: 2.0000 | ORAL_TABLET | ORAL | Status: DC | PRN

## 2022-05-26 MED ORDER — LACTATED RINGERS IV SOLN: 500.0000 mL | INTRAVENOUS | Status: DC | PRN

## 2022-05-26 MED ORDER — LIDOCAINE HCL (PF) 1 % IJ SOLN
30.0000 mL | INTRAMUSCULAR | Status: AC | PRN
  Administered 2022-05-26: 30 mL via SUBCUTANEOUS
  Filled 2022-05-26: qty 30

## 2022-05-26 MED ORDER — ONDANSETRON HCL 4 MG PO TABS: 4.0000 mg | ORAL_TABLET | ORAL | Status: DC | PRN

## 2022-05-26 MED ORDER — OXYTOCIN-SODIUM CHLORIDE 30-0.9 UT/500ML-% IV SOLN: 2.5000 [IU]/h | INTRAVENOUS | Status: DC | PRN

## 2022-05-26 MED ORDER — LACTATED RINGERS IV SOLN: INTRAVENOUS | Status: DC

## 2022-05-26 MED ORDER — TERBUTALINE SULFATE 1 MG/ML IJ SOLN: 0.2500 mg | Freq: Once | INTRAMUSCULAR | Status: DC | PRN

## 2022-05-26 MED ORDER — SIMETHICONE 80 MG PO CHEW: 80.0000 mg | CHEWABLE_TABLET | ORAL | Status: DC | PRN

## 2022-05-26 MED ORDER — SOD CITRATE-CITRIC ACID 500-334 MG/5ML PO SOLN: 30.0000 mL | ORAL | Status: DC | PRN

## 2022-05-26 MED ORDER — BENZOCAINE-MENTHOL 20-0.5 % EX AERO
1.0000 | INHALATION_SPRAY | CUTANEOUS | Status: DC | PRN
  Administered 2022-05-26: 1 via TOPICAL
  Filled 2022-05-26: qty 56

## 2022-05-26 MED ORDER — DIPHENHYDRAMINE HCL 25 MG PO CAPS: 25.0000 mg | ORAL_CAPSULE | Freq: Four times a day (QID) | ORAL | Status: DC | PRN

## 2022-05-26 MED ORDER — OXYCODONE-ACETAMINOPHEN 5-325 MG PO TABS: 1.0000 | ORAL_TABLET | ORAL | Status: DC | PRN

## 2022-05-26 MED ORDER — OXYTOCIN-SODIUM CHLORIDE 30-0.9 UT/500ML-% IV SOLN
2.5000 [IU]/h | INTRAVENOUS | Status: DC
  Administered 2022-05-26: 2.5 [IU]/h via INTRAVENOUS

## 2022-05-26 MED ORDER — ONDANSETRON HCL 4 MG/2ML IJ SOLN: 4.0000 mg | INTRAMUSCULAR | Status: DC | PRN

## 2022-05-26 MED ORDER — OXYTOCIN BOLUS FROM INFUSION
333.0000 mL | Freq: Once | INTRAVENOUS | Status: AC
  Administered 2022-05-26: 333 mL via INTRAVENOUS

## 2022-05-26 MED ORDER — IBUPROFEN 600 MG PO TABS
600.0000 mg | ORAL_TABLET | Freq: Four times a day (QID) | ORAL | Status: DC
  Administered 2022-05-26 – 2022-05-27 (×2): 600 mg via ORAL
  Filled 2022-05-26 (×3): qty 1

## 2022-05-26 MED ORDER — OXYTOCIN-SODIUM CHLORIDE 30-0.9 UT/500ML-% IV SOLN
1.0000 m[IU]/min | INTRAVENOUS | Status: DC
  Administered 2022-05-26: 2 m[IU]/min via INTRAVENOUS
  Filled 2022-05-26: qty 500

## 2022-05-26 MED ORDER — DIBUCAINE (PERIANAL) 1 % EX OINT: 1.0000 | TOPICAL_OINTMENT | CUTANEOUS | Status: DC | PRN

## 2022-05-26 MED ORDER — PRENATAL MULTIVITAMIN CH
1.0000 | ORAL_TABLET | Freq: Every day | ORAL | Status: DC
Start: 2022-05-27 — End: 2022-05-28
  Administered 2022-05-27: 1 via ORAL
  Filled 2022-05-26: qty 1

## 2022-05-26 MED ORDER — WITCH HAZEL-GLYCERIN EX PADS: 1.0000 | MEDICATED_PAD | CUTANEOUS | Status: DC | PRN

## 2022-05-26 MED ORDER — TETANUS-DIPHTH-ACELL PERTUSSIS 5-2.5-18.5 LF-MCG/0.5 IM SUSY: 0.5000 mL | PREFILLED_SYRINGE | Freq: Once | INTRAMUSCULAR | Status: DC

## 2022-05-26 MED ORDER — COCONUT OIL OIL: 1.0000 | TOPICAL_OIL | Status: DC | PRN

## 2022-05-26 MED ORDER — ZOLPIDEM TARTRATE 5 MG PO TABS: 5.0000 mg | ORAL_TABLET | Freq: Every evening | ORAL | Status: DC | PRN

## 2022-05-26 MED ORDER — SENNOSIDES-DOCUSATE SODIUM 8.6-50 MG PO TABS
2.0000 | ORAL_TABLET | Freq: Every day | ORAL | Status: DC
Start: 2022-05-27 — End: 2022-05-28
  Administered 2022-05-27: 2 via ORAL
  Filled 2022-05-26: qty 2

## 2022-05-26 NOTE — Lactation Note (Signed)
This note was copied from a baby's chart. Lactation Consultation Note  Patient Name: Madison Rocha JSEGB'T Date: 05/26/2022   Age:28 hours  LC spoke to RN in L &D. BP preference to see Lactation once on the floor.   Maternal Data    Feeding    LATCH Score Latch: Grasps breast easily, tongue down, lips flanged, rhythmical sucking.  Audible Swallowing: A few with stimulation  Type of Nipple: Everted at rest and after stimulation  Comfort (Breast/Nipple): Soft / non-tender  Hold (Positioning): Assistance needed to correctly position infant at breast and maintain latch.  LATCH Score: 8   Lactation Tools Discussed/Used    Interventions    Discharge    Consult Status      Madison Schmid  Rocha 05/26/2022, 6:06 PM

## 2022-05-26 NOTE — Lactation Note (Signed)
This note was copied from a baby's chart. Lactation Consultation Note BF parent had the baby on the breast when LC came into room. Mom stated the baby has been on the breast for 20 minutes then put in the bed, she got the baby and put back on the breast for another 20 minutes. Baby getting sleepy so LC put baby in bassinet, baby crying, cueing wanting to suck. BF parent asked for hand pump. LC assisted in hand expressing and spoon fed baby 3 ml and mom pumping to give him more. BF mom asked about formula. LC told mom it is available if needed as well as DBM. Mom asked about pacifiers. LC mentioned it is recommended to wait 2 weeks to get milk supply built up and it hides feeding cues. Mom stated her 2nd child wanted to suck all the time and needed a pacifier. Mom is worried about the baby being hungry since he is wanting to feed so much and doesn't want him hungry.  Newborn feeding habits, STS, I&O, positioning, support, supplementing, supply and demand reviewed. Mom encouraged to feed baby 8-12 times/24 hours and with feeding cues.   Encouraged mom to call for assistance or questions.  Patient Name: Madison Rocha Date: 05/26/2022 Reason for consult: Initial assessment;Term Age:11 hours  Maternal Data Has patient been taught Hand Expression?: Yes Does the patient have breastfeeding experience prior to this delivery?: Yes How long did the patient breastfeed?: 18 month to her 28 yr old and 4 months pumping to her 28 yr old  Feeding    LATCH Score Latch: Grasps breast easily, tongue down, lips flanged, rhythmical sucking.  Audible Swallowing: None  Type of Nipple: Everted at rest and after stimulation  Comfort (Breast/Nipple): Soft / non-tender  Hold (Positioning): No assistance needed to correctly position infant at breast.  LATCH Score: 8   Lactation Tools Discussed/Used Tools: Pump Breast pump type: Manual Pump Education: Setup, frequency, and cleaning;Milk  Storage Reason for Pumping: mom requested for supplementing  Interventions Interventions: Breast feeding basics reviewed;Breast massage;Hand express;Breast compression;Expressed milk;Hand pump;LC Services brochure  Discharge    Consult Status Consult Status: Follow-up Date: 05/27/22 Follow-up type: In-patient    Madison Rocha 05/26/2022, 9:45 PM

## 2022-05-26 NOTE — Progress Notes (Signed)
MD LABOR PROGRESS NOTE  Madison Rocha is a 26 y.o. G3P2002 at [redacted]w[redacted]d  admitted for induction of labor due to Post dates. Due date 05/18/22.  Subjective:  Patient managing regular contractions well.   Objective:  BP 121/69   Pulse (!) 57   Temp 98.1 F (36.7 C) (Axillary)   Ht 5\' 9"  (1.753 m)   Wt 87.7 kg   SpO2 100%   BMI 28.56 kg/m   No intake/output data recorded.  FHT:  FHR: 125 bpm, variability: moderate,  accelerations:  Present,  decelerations:  Absent UC:   regular, every 2-3 minutes SVE:   Dilation: 4 Effacement (%): 50 Station: -1 Exam by:: Genene Kilman, MD SROM/AROM: AROM performed Clear fluid  Pitocin @ 8 mu/min  Labs: Lab Results  Component Value Date   WBC 6.6 05/26/2022   HGB 11.3 (L) 05/26/2022   HCT 34.8 (L) 05/26/2022   MCV 84.7 05/26/2022   PLT 198 05/26/2022    Assessment / Plan: 26 y.o. G3P2002 [redacted]w[redacted]d transitioning to active labor Induction of labor due to postterm,  progressing well on pitocin  Labor: Progressing normally Fetal Wellbeing:  Category I Pain Control:  Labor support without medications Anticipated MOD:  NSVD  Expectant management   [redacted]w[redacted]d, MD  05/26/2022 2:38 PM

## 2022-05-26 NOTE — H&P (Signed)
OB ADMISSION HISTORY & PHYSICAL  Admission Date: 05/26/2022  8:56 AM  Admit Diagnosis: Postterm pregnancy  Madison Rocha is a 26 y.o. female [id number] [redacted]w[redacted]d presenting for induction of labor for postdates. Endorses active FM, denies LOF and vaginal bleeding. Ctx have been irregular  History of current pregnancy: [id number]   Prenatal Care with: [facility] Patient entered prenatal care at 9 wks.   EDC 05/18/22 by 9 wk U/S.   Anatomy scan:   20 wks, complete w/ anterior placenta.   Antenatal testing: for postdates started at 40 weeks  Significant prenatal problems: Vitamin D deficiency History of gestational hypertension last pregnancy History of postpartum depression  Prenatal Labs: ABO, Rh: --/--/AB POS (08/02 9030) Antibody: NEG (08/02 0923) Rubella: Immune (01/06 0000)  RPR: Nonreactive (01/06 0000)  HBsAg: Negative (01/06 0000)  HIV: Non-reactive (01/06 0000)  1 HR GCT: PASS GBS: Negative/-- (06/26 0000)  GC/CHL: Negative  Genetics: Low risk female Vaccines: Tdap: Y Flu N Covid: Y  Prenatal Transfer Tool  Maternal Diabetes: No Genetic Screening: Normal Maternal Ultrasounds/Referrals: Normal Fetal Ultrasounds or other Referrals:  None Maternal Substance Abuse:  No Significant Maternal Medications:  None Significant Maternal Lab Results:  Group B Strep negative Other Comments:  None  OB History  Gravida Para Term Preterm AB Living  3 2 2     2   SAB IAB Ectopic Multiple Live Births        0 2    # Outcome Date GA Lbr Len/2nd Weight Sex Delivery Anes PTL Lv  3 Current           2 Term 06/19/20 [redacted]w[redacted]d 09:51 / 00:09 3711 g F Vag-Spont None  LIV  1 Term 12/30/16    M    LIV    Medical / Surgical History: Past medical history:  Past Medical History:  Diagnosis Date   Medical history non-contributory     Past surgical history:  Past Surgical History:  Procedure Laterality Date   MANDIBLE SURGERY  2011   Family History:  Family History  Problem Relation Age of  Onset   Hypertension Mother     Social History:  reports that she has never smoked. She has never used smokeless tobacco. She reports that she does not drink alcohol and does not use drugs.  Allergies: Patient has no known allergies.   Current Medications at time of admission:  Prior to Admission medications   Medication Sig Start Date End Date Taking? Authorizing Provider  benzocaine-Menthol (DERMOPLAST) 20-0.5 % AERO Apply 1 application topically as needed for irritation (perineal discomfort). 06/20/20   06/22/20, CNM  coconut oil OIL Apply 1 application topically as needed. 06/20/20   06/22/20, CNM  ibuprofen (ADVIL) 600 MG tablet Take 1 tablet (600 mg total) by mouth every 6 (six) hours. 06/20/20   06/22/20, CNM  iron polysaccharides (FERREX 150) 150 MG capsule Take 1 capsule (150 mg total) by mouth daily. 06/20/20   06/22/20, CNM  Magnesium Oxide 400 (240 Mg) MG TABS Take 1 tablet (400 mg total) by mouth daily. For prevention of constipation. 06/20/20   06/22/20, CNM  metroNIDAZOLE (FLAGYL) 500 MG tablet Take 1 tablet (500 mg total) by mouth 2 (two) times daily. 10/10/20   Lamptey, 10/12/20, MD  nitrofurantoin, macrocrystal-monohydrate, (MACROBID) 100 MG capsule Take 1 capsule (100 mg total) by mouth 2 (two) times daily. 10/09/20   10/11/20, PA-C  Prenatal Vit-Fe Fumarate-FA (MULTIVITAMIN-PRENATAL) 27-0.8 MG  TABS tablet Take 1 tablet by mouth daily at 12 noon.    [provider]  senna-docusate (SENOKOT-S) 8.6-50 MG tablet Take 2 tablets by mouth daily. 06/21/20   Neta Mends, CNM    Review of Systems: Constitutional: Negative   HENT: Negative   Eyes: Negative   Respiratory: Negative   Cardiovascular: Negative   Gastrointestinal: Negative  Genitourinary: neg for bloody show, neg for LOF   Musculoskeletal: Negative   Skin: Negative   Neurological: Negative   Endo/Heme/Allergies: Negative   Psychiatric/Behavioral: Negative     Physical Exam: VS: Blood pressure 109/63, pulse 61, temperature 97.9 F (36.6 C), temperature source Axillary, height 5\' 9"  (1.753 m), weight 87.7 kg, SpO2 100 %, unknown if currently breastfeeding. AAO x3, no signs of distress Cardiovascular: RRR Respiratory: Lung fields clear to ausculation GU/GI: Abdomen gravid, non-tender, non-distended, active FM, vertex, EFW 3200 grams per Leopold's Extremities: neg edema, negative for pain, tenderness, and cords  Cervical exam:Dilation: 2.5 Effacement (%): 60 Station: -3 Exam by:: [email or web address], RN FHR: baseline rate 125 / variability moderate / accelerations present / absent decelerations TOCO: irregular ctx       Assessment: 26 y.o. 34 [redacted]w[redacted]d admitted for IOL for post dates  1st stage of labor FHR category 1 GBS neg Pain management plan: labor support   Plan:  Admit to Labor and Delivery Routine admission orders IV hydration Continuous monitoring Pitocin for labor induction and augmentation Anticipate NSVD delivery   [redacted]w[redacted]d MD 05/26/2022 1:26 PM

## 2022-05-27 LAB — CBC
HCT: 29.2 % — ABNORMAL LOW (ref 36.0–46.0)
Hemoglobin: 9.8 g/dL — ABNORMAL LOW (ref 12.0–15.0)
MCH: 28.1 pg (ref 26.0–34.0)
MCHC: 33.6 g/dL (ref 30.0–36.0)
MCV: 83.7 fL (ref 80.0–100.0)
Platelets: 191 10*3/uL (ref 150–400)
RBC: 3.49 MIL/uL — ABNORMAL LOW (ref 3.87–5.11)
RDW: 14.6 % (ref 11.5–15.5)
WBC: 11.4 10*3/uL — ABNORMAL HIGH (ref 4.0–10.5)
nRBC: 0 % (ref 0.0–0.2)

## 2022-05-27 LAB — RPR: RPR Ser Ql: NONREACTIVE

## 2022-05-27 MED ORDER — ACETAMINOPHEN 325 MG PO TABS
650.0000 mg | ORAL_TABLET | ORAL | 3 refills | Status: DC | PRN
Start: 2022-05-27 — End: 2022-12-13

## 2022-05-27 MED ORDER — IBUPROFEN 600 MG PO TABS: 600.0000 mg | ORAL_TABLET | Freq: Four times a day (QID) | ORAL | 3 refills | Status: DC | PRN

## 2022-05-27 MED ORDER — DIBUCAINE (PERIANAL) 1 % EX OINT: 1.0000 | TOPICAL_OINTMENT | CUTANEOUS | 3 refills | Status: DC | PRN

## 2022-05-27 NOTE — Discharge Summary (Signed)
[facility] Discharge Summary   Patient Name:   Madison Rocha DOB:     January 27, 1996 MRN:     [id number]  Date of Admission:   05/26/2022 Date of Discharge:  05/27/2022  Admitting diagnosis:    Post-dates pregnancy [O48.0] Principal Problem:   Post-dates pregnancy    Discharge diagnosis:    Post-dates pregnancy [O48.0] Principal Problem:   Post-dates pregnancy  Term Pregnancy Delivered        Additional problems: None                                          Post partum procedures: none Augmentation: AROM and Pitocin Complications: None  Hospital course: Induction of Labor With Vaginal Delivery   26 y.o. yo G3P3003 at 80w1dwas admitted to the hospital 05/26/2022 for induction of labor.  Indication for induction: Postdates.  Patient had an uncomplicated labor course as follows: Membrane Rupture Time/Date: 2:18 PM ,05/26/2022   Delivery Method:Vaginal, Spontaneous  Episiotomy: None  Lacerations:  1st degree  Details of delivery can be found in separate delivery note.  Patient had a routine postpartum course. Patient is discharged home 05/27/22.  Newborn Data: Birth date:05/26/2022  Birth time:5:23 PM  Gender:Female  Living status:Living  Apgars:8 ,9  Weight:3910 g   Magnesium Sulfate received: No BMZ received: No Rhophylac:N/A MMR:No T-DaP:Given prenatally Flu: No Transfusion:No                                                               Type of Delivery:  NSVD Delivering Provider: PSanjuana Kava Date of Delivery:  05/26/22  Newborn Data:  Baby Feeding:   Bottle and Breast Disposition:   home with mother  Physical Exam:   Vitals:   05/26/22 2045 05/27/22 0045 05/27/22 0503 05/27/22 1030  BP: 127/74 107/68 117/70 117/65  Pulse: 78 64 (!) 58 71  Resp: '18 18 18 18  ' Temp: 98.6 F (37 C) 98.4 F (36.9 C) 98.3 F (36.8 C) 98.3 F (36.8 C)  TempSrc: Oral Oral Oral Oral  SpO2: 98% 99% 99% 100%  Weight:      Height:       General: alert,  cooperative, and no distress Lochia: appropriate Uterine Fundus: firm Incision: N/A DVT Evaluation: No evidence of DVT seen on physical exam. Negative Homan's sign. No cords or calf tenderness.  Labs: Lab Results  Component Value Date   WBC 11.4 (H) 05/27/2022   HGB 9.8 (L) 05/27/2022   HCT 29.2 (L) 05/27/2022   MCV 83.7 05/27/2022   PLT 191 05/27/2022      Latest Ref Rng & Units 10/28/2019    9:12 PM  CMP  Glucose 70 - 99 mg/dL 97   BUN 6 - 20 mg/dL 9   Creatinine 0.44 - 1.00 mg/dL 0.61   Sodium 135 - 145 mmol/L 133   Potassium 3.5 - 5.1 mmol/L 4.3   Chloride 98 - 111 mmol/L 103   CO2 22 - 32 mmol/L 22   Calcium 8.9 - 10.3 mg/dL 9.4   Total Protein 6.5 - 8.1 g/dL 7.3   Total Bilirubin 0.3 - 1.2 mg/dL 0.2   Alkaline Phos 38 -  126 U/L 57   AST 15 - 41 U/L 17   ALT 0 - 44 U/L 13     Discharge instruction: per After Visit Summary and "Baby and Me Booklet".  After Visit Meds:  Allergies as of 05/27/2022   No Known Allergies      Medication List     TAKE these medications    acetaminophen 325 MG tablet Commonly known as: Tylenol Take 2 tablets (650 mg total) by mouth every 4 (four) hours as needed for moderate pain (for pain scale < 4).   dibucaine 1 % Oint Commonly known as: NUPERCAINAL Place 1 Application rectally as needed for hemorrhoids.   ibuprofen 600 MG tablet Commonly known as: ADVIL Take 1 tablet (600 mg total) by mouth every 6 (six) hours as needed for cramping or moderate pain.   iron polysaccharides 150 MG capsule Commonly known as: Ferrex 150 Take 1 capsule (150 mg total) by mouth daily.   magnesium oxide 400 (240 Mg) MG tablet Commonly known as: MAG-OX Take 1 tablet (400 mg total) by mouth daily. For prevention of constipation.   multivitamin-prenatal 27-0.8 MG Tabs tablet Take 1 tablet by mouth daily at 12 noon.        Diet: routine diet  Activity: Advance as tolerated. Pelvic rest for 6 weeks.   Outpatient follow up:6  weeks Follow up Appt:No future appointments. Follow up visit: No follow-ups on file.  Postpartum contraception: Not Discussed  05/27/2022 Sanjuana Kava, MD

## 2022-05-27 NOTE — Social Work (Addendum)
CSW received consult for hx of Anxiety and PPD. CSW met with [name] to offer support and complete assessment.    CSW met with [name] at bedside and introduced CSW role. CSW observed [name] in bed and the infant positioned on a pillow that she was holding. [name] presented with a smile, welcomed CSW visit and engaged with CSW during the visit. CSW inquired how [name] has been feeling since giving birth. [name] expressed that she has been feeling "good" and shared the labor and delivery went well with no concerns.  [name] reported during the pregnancy she emotionally felt "so, so" since she was sometimes happy and sad. [name] shared at the beginning of the pregnancy she was not sure if wanted to keep the baby but stated, "I no longer feel that way." [name] expressed that FOB is not involved however she does have a great support system with her mom and sister. [name] reported that her mom is caring for her older children while she is at hospital. CSW inquired about [name] mental health history. [name] denied mental health history. CSW assessed further and asked about anxiety and PPD diagnosis documented in medical record. 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 [name] expressed that she had difficulty with breastfeeding because her daughter would not latch to the breast. She had to pump breast milk and always felt frustrated due to her lack of sleep. [name] reported she felt much better after she stopped breastfeeding the infant at 32 months old. [name] expressed that she is feeling "annoyed right now" because the baby is "using me as a pacifier." When she takes the infant away from the breast he cries, does not want to go to sleep and wants the breast for comfort. CSW provided active listening and encouraged [name] to talk with the lactation consultant about her concerns and options. [name] reported understanding.   CSW provided education regarding the baby blues period vs. perinatal mood disorders,  discussed treatment and gave resources for mental health follow up if concerns arise. CSW recommended [name] complete a self-evaluation during the postpartum time period using the New Mom Checklist from Postpartum Progress and encouraged [name] to contact a medical professional if symptoms are noted at any time. [name] reported that she feels comfortable reaching out to her OB provider if concerns arise. CSW assessed [name] for safety. [name] denied thoughts of harm to self and others.    [name] reported she has a car seat, bassinet and will assistance with additional diapers and wipes. She receives WIC/FS and will call to update both agencies about the infant's birth. CSW discussed ongoing community support. [name] gave CSW permission to make a referral to [facility] and [facility].  CSW provided review of Sudden Infant Death Syndrome (SIDS) precautions. [name] has chosen [facility] for the infant's follow up care.   CSW identifies no further need for intervention and no barriers to discharge at this time.   Madison Rocha, MSW, LCSW [facility] [place] Worker  [phone] 05/27/2022  11:07 AM

## 2022-05-28 LAB — BIRTH TISSUE RECOVERY COLLECTION (PLACENTA DONATION)

## 2022-06-05 ENCOUNTER — Telehealth (HOSPITAL_COMMUNITY): Payer: Self-pay

## 2022-06-05 NOTE — Telephone Encounter (Signed)
Patient reports feeling good. Patient declines questions/concerns about her health and healing.  Patient reports that baby is doing well. Eating, peeing/pooping, and gaining weight well. Baby sleeps in a bassinet. RN reviewed ABC's of safe sleep with patient. Patient declines any questions or concerns about baby.  EPDS score is 0.  Marcelino Duster [facility]  06/05/22,1449

## 2022-06-09 DIAGNOSIS — F330 Major depressive disorder, recurrent, mild: Secondary | ICD-10-CM | POA: Diagnosis not present

## 2022-06-19 DIAGNOSIS — F330 Major depressive disorder, recurrent, mild: Secondary | ICD-10-CM | POA: Diagnosis not present

## 2022-06-24 DIAGNOSIS — F330 Major depressive disorder, recurrent, mild: Secondary | ICD-10-CM | POA: Diagnosis not present

## 2022-07-01 DIAGNOSIS — F330 Major depressive disorder, recurrent, mild: Secondary | ICD-10-CM | POA: Diagnosis not present

## 2022-07-05 DIAGNOSIS — F330 Major depressive disorder, recurrent, mild: Secondary | ICD-10-CM | POA: Diagnosis not present

## 2022-07-06 DIAGNOSIS — Z01419 Encounter for gynecological examination (general) (routine) without abnormal findings: Secondary | ICD-10-CM | POA: Diagnosis not present

## 2022-07-06 DIAGNOSIS — Z3040 Encounter for surveillance of contraceptives, unspecified: Secondary | ICD-10-CM | POA: Diagnosis not present

## 2022-07-13 DIAGNOSIS — F330 Major depressive disorder, recurrent, mild: Secondary | ICD-10-CM | POA: Diagnosis not present

## 2022-07-20 DIAGNOSIS — F330 Major depressive disorder, recurrent, mild: Secondary | ICD-10-CM | POA: Diagnosis not present

## 2022-07-28 DIAGNOSIS — F330 Major depressive disorder, recurrent, mild: Secondary | ICD-10-CM | POA: Diagnosis not present

## 2022-08-03 DIAGNOSIS — F330 Major depressive disorder, recurrent, mild: Secondary | ICD-10-CM | POA: Diagnosis not present

## 2022-08-12 DIAGNOSIS — F330 Major depressive disorder, recurrent, mild: Secondary | ICD-10-CM | POA: Diagnosis not present

## 2022-08-19 DIAGNOSIS — F330 Major depressive disorder, recurrent, mild: Secondary | ICD-10-CM | POA: Diagnosis not present

## 2022-08-26 DIAGNOSIS — F330 Major depressive disorder, recurrent, mild: Secondary | ICD-10-CM | POA: Diagnosis not present

## 2022-09-02 DIAGNOSIS — F330 Major depressive disorder, recurrent, mild: Secondary | ICD-10-CM | POA: Diagnosis not present

## 2022-09-07 DIAGNOSIS — F330 Major depressive disorder, recurrent, mild: Secondary | ICD-10-CM | POA: Diagnosis not present

## 2022-09-22 DIAGNOSIS — F330 Major depressive disorder, recurrent, mild: Secondary | ICD-10-CM | POA: Diagnosis not present

## 2022-09-30 DIAGNOSIS — F330 Major depressive disorder, recurrent, mild: Secondary | ICD-10-CM | POA: Diagnosis not present

## 2022-10-13 DIAGNOSIS — F330 Major depressive disorder, recurrent, mild: Secondary | ICD-10-CM | POA: Diagnosis not present

## 2022-12-09 ENCOUNTER — Ambulatory Visit (HOSPITAL_COMMUNITY): Payer: Medicaid Other

## 2022-12-09 ENCOUNTER — Ambulatory Visit (HOSPITAL_COMMUNITY)
Admission: EM | Admit: 2022-12-09 | Discharge: 2022-12-09 | Disposition: A | Discharge status: Home / Self Care | Payer: Medicaid Other | Attending: Family Medicine | Admitting: Family Medicine

## 2022-12-09 ENCOUNTER — Encounter (HOSPITAL_COMMUNITY): Payer: Self-pay | Admitting: *Deleted

## 2022-12-09 DIAGNOSIS — H6121 Impacted cerumen, right ear: Secondary | ICD-10-CM | POA: Diagnosis not present

## 2022-12-09 NOTE — ED Notes (Signed)
Right ear flushed with peroxide and warm water,no wax coming out,pt states that it feels worse.  Dr Eustace Moore made aware.

## 2022-12-09 NOTE — ED Triage Notes (Signed)
Pt states her right ear is hurting and feels clogged x 2 days. She has put peroxide and held her ear over hot water.

## 2022-12-09 NOTE — Discharge Instructions (Signed)
You have cerumen in the right ear that we were not able to clean out fully today.   One option is to see an ear, nose and throat specialist to have this removed.  You may call [phone] to make this appointment.  The other option is to use over the counter ear wax softener for about a week, and then return for re-cleaning.

## 2022-12-09 NOTE — ED Provider Notes (Signed)
[facility]    CSN: [id number] Arrival date & time: 12/09/22  1052      History   Chief Complaint Chief Complaint  Patient presents with   Ear Fullness   Otalgia    HPI Madison Rocha is a 27 y.o. female.   Patient is here for right ear feeling clogged with pain x 2 days.  Some runny nose, congestion.  No fevers/chills.  No cough.  The left ear feels okay.        Past Medical History:  Diagnosis Date   Medical history non-contributory     Patient Active Problem List   Diagnosis Date Noted   Post-dates pregnancy 05/26/2022   Postpartum care following vaginal delivery 8/26 06/20/2020   Perineal laceration with delivery, first degree 06/20/2020   Maternal anemia, with delivery - IDA with superimposed ABL anemia 06/20/2020   SVD (spontaneous vaginal delivery) 06/19/2020    Past Surgical History:  Procedure Laterality Date   MANDIBLE SURGERY  2011    OB History     Gravida  3   Para  3   Term  3   Preterm      AB      Living  3      SAB      IAB      Ectopic      Multiple  0   Live Births  3            Home Medications    Prior to Admission medications   Medication Sig Start Date End Date Taking? Authorizing Provider  acetaminophen (TYLENOL) 325 MG tablet Take 2 tablets (650 mg total) by mouth every 4 (four) hours as needed for moderate pain (for pain scale < 4). 05/27/22   Sanjuana Kava, MD  dibucaine (NUPERCAINAL) 1 % OINT Place 1 Application rectally as needed for hemorrhoids. 05/27/22   Sanjuana Kava, MD  ibuprofen (ADVIL) 600 MG tablet Take 1 tablet (600 mg total) by mouth every 6 (six) hours as needed for cramping or moderate pain. 05/27/22   Sanjuana Kava, MD  iron polysaccharides (FERREX 150) 150 MG capsule Take 1 capsule (150 mg total) by mouth daily. Patient not taking: Reported on 05/27/2022 06/20/20   Juliene Pina, CNM  Magnesium Oxide 400 (240 Mg) MG TABS Take 1 tablet (400 mg total) by mouth daily. For  prevention of constipation. Patient not taking: Reported on 05/27/2022 06/20/20   Juliene Pina, CNM  Prenatal Vit-Fe Fumarate-FA (MULTIVITAMIN-PRENATAL) 27-0.8 MG TABS tablet Take 1 tablet by mouth daily at 12 noon. Patient not taking: Reported on 05/27/2022    [provider]    Family History Family History  Problem Relation Age of Onset   Hypertension Mother     Social History Social History   Tobacco Use   Smoking status: Never   Smokeless tobacco: Never  Vaping Use   Vaping Use: Never used  Substance Use Topics   Alcohol use: Never   Drug use: Never     Allergies   Patient has no known allergies.   Review of Systems Review of Systems  Constitutional:  Negative for chills and fever.  HENT:  Positive for ear pain and rhinorrhea.   Respiratory: Negative.    Cardiovascular: Negative.   Gastrointestinal: Negative.   Musculoskeletal: Negative.   Psychiatric/Behavioral: Negative.       Physical Exam Triage Vital Signs ED Triage Vitals  Enc Vitals Group     BP 12/09/22 1231 122/78  Pulse Rate 12/09/22 1231 65     Resp 12/09/22 1231 18     Temp 12/09/22 1231 98.3 F (36.8 C)     Temp src --      SpO2 12/09/22 1231 98 %     Weight --      Height --      Head Circumference --      Peak Flow --      Pain Score 12/09/22 1230 0     Pain Loc --      Pain Edu? --      Excl. in [place]? --    No data found.  Updated Vital Signs BP 122/78 (BP Location: Right Arm)   Pulse 65   Temp 98.3 F (36.8 C)   Resp 18   LMP 12/06/2022 (Approximate)   SpO2 98%   Visual Acuity Right Eye Distance:   Left Eye Distance:   Bilateral Distance:    Right Eye Near:   Left Eye Near:    Bilateral Near:     Physical Exam Constitutional:      Appearance: Normal appearance.  HENT:     Right Ear: There is impacted cerumen.     Left Ear: There is no impacted cerumen.  Cardiovascular:     Rate and Rhythm: Normal rate and regular rhythm.  Pulmonary:     Effort:  Pulmonary effort is normal.     Breath sounds: Normal breath sounds.  Musculoskeletal:     Cervical back: Normal range of motion.  Skin:    General: Skin is warm.  Neurological:     General: No focal deficit present.     Mental Status: She is alert.  Psychiatric:        Mood and Affect: Mood normal.      UC Treatments / Results  Labs (all labs ordered are listed, but only abnormal results are displayed) Labs Reviewed - No data to display  EKG   Radiology No results found.  Procedures Procedures (including critical care time)  Medications Ordered in UC Medications - No data to display  Initial Impression / Assessment and Plan / UC Course  I have reviewed the triage vital signs and the nursing notes.  Pertinent labs & imaging results that were available during my care of the patient were reviewed by me and considered in my medical decision making (see chart for details).   Final Clinical Impressions(s) / UC Diagnoses   Final diagnoses:  Impacted cerumen of right ear     Discharge Instructions      You have cerumen in the right ear that we were not able to clean out fully today.   One option is to see an ear, nose and throat specialist to have this removed.  You may call [phone] to make this appointment.  The other option is to use over the counter ear wax softener for about a week, and then return for re-cleaning.     ED Prescriptions   None    PDMP not reviewed this encounter.   Rondel Oh, MD 12/09/22 1323

## 2022-12-13 ENCOUNTER — Encounter: Payer: Self-pay | Admitting: Physician Assistant

## 2022-12-13 ENCOUNTER — Ambulatory Visit: Payer: Medicaid Other | Admitting: Physician Assistant

## 2022-12-13 VITALS — BP 132/79 | HR 67 | Ht 69.0 in | Wt 165.0 lb

## 2022-12-13 DIAGNOSIS — Z111 Encounter for screening for respiratory tuberculosis: Secondary | ICD-10-CM | POA: Diagnosis not present

## 2022-12-13 DIAGNOSIS — Z23 Encounter for immunization: Secondary | ICD-10-CM

## 2022-12-13 DIAGNOSIS — Z021 Encounter for pre-employment examination: Secondary | ICD-10-CM | POA: Diagnosis not present

## 2022-12-13 DIAGNOSIS — Z862 Personal history of diseases of the blood and blood-forming organs and certain disorders involving the immune mechanism: Secondary | ICD-10-CM | POA: Diagnosis not present

## 2022-12-13 NOTE — Progress Notes (Signed)
New Patient Office Visit  Subjective    Patient ID: Madison Rocha, female    DOB: Jun 23, 1996  Age: 27 y.o. MRN: [id number]  CC:  Chief Complaint  Patient presents with   Employment Physical    [employer]      HPI Madison Rocha presents for pre-employment physical without concern.  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   States that she does have history of anemia, does not take iron.  States sleep is good, appetite is good, mood is stable.  Denies history of back or knee pain    Outpatient Encounter Medications as of 12/13/2022  Medication Sig   [DISCONTINUED] acetaminophen (TYLENOL) 325 MG tablet Take 2 tablets (650 mg total) by mouth every 4 (four) hours as needed for moderate pain (for pain scale < 4). (Patient not taking: Reported on 12/13/2022)   [DISCONTINUED] dibucaine (NUPERCAINAL) 1 % OINT Place 1 Application rectally as needed for hemorrhoids. (Patient not taking: Reported on 12/13/2022)   [DISCONTINUED] ibuprofen (ADVIL) 600 MG tablet Take 1 tablet (600 mg total) by mouth every 6 (six) hours as needed for cramping or moderate pain. (Patient not taking: Reported on 12/13/2022)   [DISCONTINUED] iron polysaccharides (FERREX 150) 150 MG capsule Take 1 capsule (150 mg total) by mouth daily. (Patient not taking: Reported on 05/27/2022)   [DISCONTINUED] Magnesium Oxide 400 (240 Mg) MG TABS Take 1 tablet (400 mg total) by mouth daily. For prevention of constipation. (Patient not taking: Reported on 05/27/2022)   [DISCONTINUED] Prenatal Vit-Fe Fumarate-FA (MULTIVITAMIN-PRENATAL) 27-0.8 MG TABS tablet Take 1 tablet by mouth daily at 12 noon. (Patient not taking: Reported on 05/27/2022)   No facility-administered encounter medications on file as of 12/13/2022.    Past Medical History:  Diagnosis Date   Medical history non-contributory     Past Surgical History:  Procedure Laterality Date   MANDIBLE SURGERY  2011     Family History  Problem Relation Age of Onset   Hypertension Mother     Social History   Socioeconomic History   Marital status: Single    Spouse name: Not on file   Number of children: Not on file   Years of education: Not on file   Highest education level: Not on file  Occupational History   Not on file  Tobacco Use   Smoking status: Never   Smokeless tobacco: Never  Vaping Use   Vaping Use: Never used  Substance and Sexual Activity   Alcohol use: Never   Drug use: Never   Sexual activity: Yes    Birth control/protection: None  Other Topics Concern   Not on file  Social History Narrative   Not on file   Social Determinants of Health   Financial Resource Strain: Not on file  Food Insecurity: Not on file  Transportation Needs: Not on file  Physical Activity: Not on file  Stress: Not on file  Social Connections: Not on file  Intimate Partner Violence: Not on file    Review of Systems  Constitutional: Negative.   HENT: Negative.    Eyes: Negative.   Respiratory:  Negative for shortness of breath.   Cardiovascular:  Negative for chest pain.  Gastrointestinal: Negative.   Genitourinary: Negative.   Musculoskeletal:  Negative for back pain and joint pain.  Skin: Negative.   Neurological: Negative.   Endo/Heme/Allergies: Negative.   Psychiatric/Behavioral: Negative.          Objective    BP 132/79 (  BP Location: Left Arm, Patient Position: Sitting, Cuff Size: Normal)   Pulse 67   Ht 5' 9"$  (1.753 m)   Wt 165 lb (74.8 kg)   LMP 12/06/2022 (Approximate)   SpO2 100%   BMI 24.37 kg/m   Physical Exam Vitals and nursing note reviewed.  Constitutional:      Appearance: Normal appearance.  HENT:     Head: Normocephalic and atraumatic.     Right Ear: External ear normal.     Left Ear: External ear normal.     Nose: Nose normal.     Mouth/Throat:     Mouth: Mucous membranes are moist.     Pharynx: Oropharynx is clear.  Eyes:     Extraocular  Movements: Extraocular movements intact.     Conjunctiva/sclera: Conjunctivae normal.     Pupils: Pupils are equal, round, and reactive to light.  Cardiovascular:     Rate and Rhythm: Normal rate and regular rhythm.     Pulses: Normal pulses.     Heart sounds: Normal heart sounds.  Pulmonary:     Effort: Pulmonary effort is normal.     Breath sounds: Normal breath sounds.  Musculoskeletal:        General: Normal range of motion.     Cervical back: Normal range of motion and neck supple.  Skin:    General: Skin is warm and dry.  Neurological:     General: No focal deficit present.     Mental Status: She is alert and oriented to person, place, and time.  Psychiatric:        Mood and Affect: Mood normal.        Behavior: Behavior normal.        Thought Content: Thought content normal.        Judgment: Judgment normal.        Assessment & Plan:   Problem List Items Addressed This Visit   None Visit Diagnoses     History of anemia    -  Primary   Relevant Orders   CBC with Differential/Platelet   Iron, TIBC and Ferritin Panel   Physical exam, pre-employment       Screening for tuberculosis       Relevant Orders   PPD   Flu vaccine need       Relevant Orders   Flu Vaccine QUAD 6+ mos PF IM (Fluarix Quad PF) (Completed)     1. Physical exam, pre-employment Paperwork completed on patient's behalf.  Patient encouraged to make appointment to establish care with primary care provider, helped patient locate primary care provider information.  Patient education given on general health maintenance.  2. History of anemia  - CBC with Differential/Platelet - Iron, TIBC and Ferritin Panel  3. Screening for tuberculosis Patient given [facility], no available on clinic - PPD; Future  4. Flu vaccine need  - Flu Vaccine QUAD 6+ mos PF IM (Fluarix Quad PF)   I have reviewed the patient's medical history  (PMH, PSH, Social History, Family History, Medications, and allergies) , and have been updated if relevant. I spent 25 minutes reviewing chart and  face to face time with patient.   Return if symptoms worsen or fail to improve.   Loraine Grip Mayers, PA-C

## 2022-12-13 NOTE — Patient Instructions (Addendum)
We will cal you with your lab result when it is available .  Please let us know if there is anything else we can do for you  Kennieth Rad, PA-C Physician Assistant [place] [email or web address]   Health Maintenance, Female Adopting a healthy lifestyle and getting preventive care are important in promoting health and wellness. Ask your health care provider about: The right schedule for you to have regular tests and exams. Things you can do on your own to prevent diseases and keep yourself healthy. What should I know about diet, weight, and exercise? Eat a healthy diet  Eat a diet that includes plenty of vegetables, fruits, low-fat dairy products, and lean protein. Do not eat a lot of foods that are high in solid fats, added sugars, or sodium. Maintain a healthy weight Body mass index (BMI) is used to identify weight problems. It estimates body fat based on height and weight. Your health care provider can help determine your BMI and help you achieve or maintain a healthy weight. Get regular exercise Get regular exercise. This is one of the most important things you can do for your health. Most adults should: Exercise for at least 150 minutes each week. The exercise should increase your heart rate and make you sweat (moderate-intensity exercise). Do strengthening exercises at least twice a week. This is in addition to the moderate-intensity exercise. Spend less time sitting. Even light physical activity can be beneficial. Watch cholesterol and blood lipids Have your blood tested for lipids and cholesterol at 29 years of age, then have this test every 5 years. Have your cholesterol levels checked more often if: Your lipid or cholesterol levels are high. You are older than 29 years of age. You are at high risk for heart disease. What should I know about cancer screening? Depending on your health history and family history, you may  need to have cancer screening at various ages. This may include screening for: Breast cancer. Cervical cancer. Colorectal cancer. Skin cancer. Lung cancer. What should I know about heart disease, diabetes, and high blood pressure? Blood pressure and heart disease High blood pressure causes heart disease and increases the risk of stroke. This is more likely to develop in people who have high blood pressure readings or are overweight. Have your blood pressure checked: Every 3-5 years if you are 102-59 years of age. Every year if you are 32 years old or older. Diabetes Have regular diabetes screenings. This checks your fasting blood sugar level. Have the screening done: Once every three years after age 48 if you are at a normal weight and have a low risk for diabetes. More often and at a younger age if you are overweight or have a high risk for diabetes. What should I know about preventing infection? Hepatitis B If you have a higher risk for hepatitis B, you should be screened for this virus. Talk with your health care provider to find out if you are at risk for hepatitis B infection. Hepatitis C Testing is recommended for: Everyone born from [birth year] through 1965. Anyone with known risk factors for hepatitis C. Sexually transmitted infections (STIs) Get screened for STIs, including gonorrhea and chlamydia, if: You are sexually active and are younger than 29 years of age. You are older than 29 years of age and your health care provider tells you that you are at risk for this type of infection. Your sexual activity has changed since you were last screened, and you are at increased  risk for chlamydia or gonorrhea. Ask your health care provider if you are at risk. Ask your health care provider about whether you are at high risk for HIV. Your health care provider may recommend a prescription medicine to help prevent HIV infection. If you choose to take medicine to prevent HIV, you should first get  tested for HIV. You should then be tested every 3 months for as long as you are taking the medicine. Pregnancy If you are about to stop having your period (premenopausal) and you may become pregnant, seek counseling before you get pregnant. Take 400 to 800 micrograms (mcg) of folic acid every day if you become pregnant. Ask for birth control (contraception) if you want to prevent pregnancy. Osteoporosis and menopause Osteoporosis is a disease in which the bones lose minerals and strength with aging. This can result in bone fractures. If you are 5 years old or older, or if you are at risk for osteoporosis and fractures, ask your health care provider if you should: Be screened for bone loss. Take a calcium or vitamin D supplement to lower your risk of fractures. Be given hormone replacement therapy (HRT) to treat symptoms of menopause. Follow these instructions at home: Alcohol use Do not drink alcohol if: Your health care provider tells you not to drink. You are pregnant, may be pregnant, or are planning to become pregnant. If you drink alcohol: Limit how much you have to: 0-1 drink a day. Know how much alcohol is in your drink. In the [place] one drink equals one 12 oz bottle of beer (355 mL), one 5 oz glass of wine (148 mL), or one 1 oz glass of hard liquor (44 mL). Lifestyle Do not use any products that contain nicotine or tobacco. These products include cigarettes, chewing tobacco, and vaping devices, such as e-cigarettes. If you need help quitting, ask your health care provider. Do not use street drugs. Do not share needles. Ask your health care provider for help if you need support or information about quitting drugs. General instructions Schedule regular health, dental, and eye exams. Stay current with your vaccines. Tell your health care provider if: You often feel depressed. You have ever been abused or do not feel safe at home. Summary Adopting a healthy lifestyle and getting  preventive care are important in promoting health and wellness. Follow your health care provider's instructions about healthy diet, exercising, and getting tested or screened for diseases. Follow your health care provider's instructions on monitoring your cholesterol and blood pressure. This information is not intended to replace advice given to you by your health care provider. Make sure you discuss any questions you have with your health care provider. Document Revised: 03/02/2021 Document Reviewed: 03/02/2021 Elsevier Patient Education  [place].

## 2022-12-14 ENCOUNTER — Ambulatory Visit: Payer: Medicaid Other

## 2022-12-14 DIAGNOSIS — Z111 Encounter for screening for respiratory tuberculosis: Secondary | ICD-10-CM

## 2022-12-14 LAB — IRON,TIBC AND FERRITIN PANEL
Ferritin: 20 ng/mL (ref 15–150)
Iron Saturation: 12 % — ABNORMAL LOW (ref 15–55)
Iron: 37 ug/dL (ref 27–159)
Total Iron Binding Capacity: 315 ug/dL (ref 250–450)
UIBC: 278 ug/dL (ref 131–425)

## 2022-12-14 LAB — CBC WITH DIFFERENTIAL/PLATELET

## 2022-12-14 NOTE — Progress Notes (Signed)
Tuberculin skin test  administered in Right Forearm 6 mm transient wheal noted. Patient denies pain at site. Information sheet given. Post skin test instructions given .Patient instructed to return 48-72 hour after skin  test has been administered. Voiced understanding of all instructions given.

## 2022-12-16 DIAGNOSIS — Z6823 Body mass index (BMI) 23.0-23.9, adult: Secondary | ICD-10-CM | POA: Diagnosis not present

## 2022-12-16 DIAGNOSIS — N946 Dysmenorrhea, unspecified: Secondary | ICD-10-CM | POA: Diagnosis not present

## 2022-12-16 DIAGNOSIS — N920 Excessive and frequent menstruation with regular cycle: Secondary | ICD-10-CM | POA: Diagnosis not present

## 2022-12-16 DIAGNOSIS — Z0001 Encounter for general adult medical examination with abnormal findings: Secondary | ICD-10-CM | POA: Diagnosis not present

## 2022-12-16 DIAGNOSIS — Z3040 Encounter for surveillance of contraceptives, unspecified: Secondary | ICD-10-CM | POA: Diagnosis not present

## 2022-12-16 DIAGNOSIS — Z124 Encounter for screening for malignant neoplasm of cervix: Secondary | ICD-10-CM | POA: Diagnosis not present

## 2022-12-16 DIAGNOSIS — R87810 Cervical high risk human papillomavirus (HPV) DNA test positive: Secondary | ICD-10-CM | POA: Diagnosis not present

## 2022-12-17 ENCOUNTER — Ambulatory Visit: Payer: Medicaid Other | Attending: Family Medicine

## 2022-12-17 DIAGNOSIS — Z23 Encounter for immunization: Secondary | ICD-10-CM

## 2022-12-17 LAB — TB SKIN TEST
Induration: 0 mm
TB Skin Test: NEGATIVE

## 2022-12-20 ENCOUNTER — Other Ambulatory Visit: Payer: Self-pay | Admitting: Obstetrics & Gynecology

## 2022-12-20 DIAGNOSIS — Z3040 Encounter for surveillance of contraceptives, unspecified: Secondary | ICD-10-CM

## 2022-12-21 DIAGNOSIS — H6123 Impacted cerumen, bilateral: Secondary | ICD-10-CM | POA: Insufficient documentation

## 2022-12-21 DIAGNOSIS — H900 Conductive hearing loss, bilateral: Secondary | ICD-10-CM | POA: Insufficient documentation

## 2022-12-28 DIAGNOSIS — F330 Major depressive disorder, recurrent, mild: Secondary | ICD-10-CM | POA: Diagnosis not present

## 2023-01-13 ENCOUNTER — Other Ambulatory Visit: Payer: Medicaid Other

## 2023-02-03 ENCOUNTER — Other Ambulatory Visit: Payer: Medicaid Other

## 2023-02-11 DIAGNOSIS — R87610 Atypical squamous cells of undetermined significance on cytologic smear of cervix (ASC-US): Secondary | ICD-10-CM | POA: Diagnosis not present

## 2023-02-11 DIAGNOSIS — Z3202 Encounter for pregnancy test, result negative: Secondary | ICD-10-CM | POA: Diagnosis not present

## 2023-02-11 DIAGNOSIS — R87810 Cervical high risk human papillomavirus (HPV) DNA test positive: Secondary | ICD-10-CM | POA: Diagnosis not present

## 2023-02-11 DIAGNOSIS — N871 Moderate cervical dysplasia: Secondary | ICD-10-CM | POA: Diagnosis not present

## 2023-02-21 ENCOUNTER — Other Ambulatory Visit: Payer: Medicaid Other

## 2023-03-01 ENCOUNTER — Ambulatory Visit
Admission: RE | Admit: 2023-03-01 | Discharge: 2023-03-01 | Disposition: A | Discharge status: Home / Self Care | Payer: Medicaid Other | Source: Ambulatory Visit | Attending: Obstetrics & Gynecology | Admitting: Obstetrics & Gynecology

## 2023-03-01 DIAGNOSIS — R102 Pelvic and perineal pain: Secondary | ICD-10-CM | POA: Diagnosis not present

## 2023-03-01 DIAGNOSIS — N921 Excessive and frequent menstruation with irregular cycle: Secondary | ICD-10-CM | POA: Diagnosis not present

## 2023-03-01 DIAGNOSIS — Z3040 Encounter for surveillance of contraceptives, unspecified: Secondary | ICD-10-CM

## 2023-03-06 DIAGNOSIS — H5213 Myopia, bilateral: Secondary | ICD-10-CM | POA: Diagnosis not present

## 2023-03-16 DIAGNOSIS — N871 Moderate cervical dysplasia: Secondary | ICD-10-CM | POA: Diagnosis not present

## 2023-03-16 DIAGNOSIS — D069 Carcinoma in situ of cervix, unspecified: Secondary | ICD-10-CM | POA: Diagnosis not present

## 2023-05-25 ENCOUNTER — Telehealth: Payer: Self-pay

## 2023-05-25 NOTE — Telephone Encounter (Signed)
Copied from CRM [id number]. Topic: General - Inquiry >> May 25, 2023 10:05 AM Madison Rocha wrote: Patient called and stated she had her TB test and her physical done by office on 12/13/2022 + 12/14/2022 and she was requesting to have a form completed in regards to her Physical she had done on 12/13/2022  Patients callback @ [phone] ASAP.

## 2023-05-25 NOTE — Telephone Encounter (Signed)
Contacted patient to discuss Mobile schedule and locations. Patient is unable to come to the unit today, but has agreed to come to our Wednesday August 7th location [place] [employer].  Address provided to patient.

## 2023-06-01 ENCOUNTER — Encounter: Payer: Self-pay | Admitting: Physician Assistant

## 2023-06-01 ENCOUNTER — Ambulatory Visit: Payer: Medicaid Other | Admitting: Physician Assistant

## 2023-06-01 VITALS — BP 115/78 | HR 66 | Ht 68.0 in | Wt 158.0 lb

## 2023-06-01 DIAGNOSIS — Z021 Encounter for pre-employment examination: Secondary | ICD-10-CM

## 2023-06-01 DIAGNOSIS — Z862 Personal history of diseases of the blood and blood-forming organs and certain disorders involving the immune mechanism: Secondary | ICD-10-CM | POA: Diagnosis not present

## 2023-06-01 NOTE — Patient Instructions (Signed)
We will call you with todays lab results.  Please let us know if there is anything else we can help you with,  Roney Jaffe, PA-C Physician Assistant [facility] [email or web address]   Health Maintenance, Female Adopting a healthy lifestyle and getting preventive care are important in promoting health and wellness. Ask your health care provider about: The right schedule for you to have regular tests and exams. Things you can do on your own to prevent diseases and keep yourself healthy. What should I know about diet, weight, and exercise? Eat a healthy diet  Eat a diet that includes plenty of vegetables, fruits, low-fat dairy products, and lean protein. Do not eat a lot of foods that are high in solid fats, added sugars, or sodium. Maintain a healthy weight Body mass index (BMI) is used to identify weight problems. It estimates body fat based on height and weight. Your health care provider can help determine your BMI and help you achieve or maintain a healthy weight. Get regular exercise Get regular exercise. This is one of the most important things you can do for your health. Most adults should: Exercise for at least 150 minutes each week. The exercise should increase your heart rate and make you sweat (moderate-intensity exercise). Do strengthening exercises at least twice a week. This is in addition to the moderate-intensity exercise. Spend less time sitting. Even light physical activity can be beneficial. Watch cholesterol and blood lipids Have your blood tested for lipids and cholesterol at 29 years of age, then have this test every 5 years. Have your cholesterol levels checked more often if: Your lipid or cholesterol levels are high. You are older than 29 years of age. You are at high risk for heart disease. What should I know about cancer screening? Depending on your health history and family history, you may need to have  cancer screening at various ages. This may include screening for: Breast cancer. Cervical cancer. Colorectal cancer. Skin cancer. Lung cancer. What should I know about heart disease, diabetes, and high blood pressure? Blood pressure and heart disease High blood pressure causes heart disease and increases the risk of stroke. This is more likely to develop in people who have high blood pressure readings or are overweight. Have your blood pressure checked: Every 3-5 years if you are 29-29 years of age. Every year if you are 29 years old or older. Diabetes Have regular diabetes screenings. This checks your fasting blood sugar level. Have the screening done: Once every three years after age 58 if you are at a normal weight and have a low risk for diabetes. More often and at a younger age if you are overweight or have a high risk for diabetes. What should I know about preventing infection? Hepatitis B If you have a higher risk for hepatitis B, you should be screened for this virus. Talk with your health care provider to find out if you are at risk for hepatitis B infection. Hepatitis C Testing is recommended for: Everyone born from [birth year] through 1965. Anyone with known risk factors for hepatitis C. Sexually transmitted infections (STIs) Get screened for STIs, including gonorrhea and chlamydia, if: You are sexually active and are younger than 29 years of age. You are older than 29 years of age and your health care provider tells you that you are at risk for this type of infection. Your sexual activity has changed since you were last screened, and you are at increased risk for chlamydia or gonorrhea.  Ask your health care provider if you are at risk. Ask your health care provider about whether you are at high risk for HIV. Your health care provider may recommend a prescription medicine to help prevent HIV infection. If you choose to take medicine to prevent HIV, you should first get tested for HIV.  You should then be tested every 3 months for as long as you are taking the medicine. Pregnancy If you are about to stop having your period (premenopausal) and you may become pregnant, seek counseling before you get pregnant. Take 400 to 800 micrograms (mcg) of folic acid every day if you become pregnant. Ask for birth control (contraception) if you want to prevent pregnancy. Osteoporosis and menopause Osteoporosis is a disease in which the bones lose minerals and strength with aging. This can result in bone fractures. If you are 29 years old or older, or if you are at risk for osteoporosis and fractures, ask your health care provider if you should: Be screened for bone loss. Take a calcium or vitamin D supplement to lower your risk of fractures. Be given hormone replacement therapy (HRT) to treat symptoms of menopause. Follow these instructions at home: Alcohol use Do not drink alcohol if: Your health care provider tells you not to drink. You are pregnant, may be pregnant, or are planning to become pregnant. If you drink alcohol: Limit how much you have to: 0-1 drink a day. Know how much alcohol is in your drink. In the [place] one drink equals one 12 oz bottle of beer (355 mL), one 5 oz glass of wine (148 mL), or one 1 oz glass of hard liquor (44 mL). Lifestyle Do not use any products that contain nicotine or tobacco. These products include cigarettes, chewing tobacco, and vaping devices, such as e-cigarettes. If you need help quitting, ask your health care provider. Do not use street drugs. Do not share needles. Ask your health care provider for help if you need support or information about quitting drugs. General instructions Schedule regular health, dental, and eye exams. Stay current with your vaccines. Tell your health care provider if: You often feel depressed. You have ever been abused or do not feel safe at home. Summary Adopting a healthy lifestyle and getting preventive care  are important in promoting health and wellness. Follow your health care provider's instructions about healthy diet, exercising, and getting tested or screened for diseases. Follow your health care provider's instructions on monitoring your cholesterol and blood pressure. This information is not intended to replace advice given to you by your health care provider. Make sure you discuss any questions you have with your health care provider. Document Revised: 03/02/2021 Document Reviewed: 03/02/2021 Elsevier Patient Education  2024 [employer].

## 2023-06-01 NOTE — Progress Notes (Signed)
Established Patient Office Visit  Subjective   Patient ID: Madison Rocha, female    DOB: [birth year]  Age: 27 y.o. MRN: [id number]  Chief Complaint  Patient presents with   pre employment physical    Presents for preemployment physical.  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  Denies any history of back pain or knee pain.  States that she is not taking iron on a daily basis.  No other concerns at this time    Past Medical History:  Diagnosis Date   Medical history non-contributory    Social History   Socioeconomic History   Marital status: Single    Spouse name: Not on file   Number of children: Not on file   Years of education: Not on file   Highest education level: Not on file  Occupational History   Not on file  Tobacco Use   Smoking status: Never   Smokeless tobacco: Never  Vaping Use   Vaping status: Never Used  Substance and Sexual Activity   Alcohol use: Never   Drug use: Never   Sexual activity: Yes    Birth control/protection: None  Other Topics Concern   Not on file  Social History Narrative   Not on file   Social Determinants of Health   Financial Resource Strain: Not on file  Food Insecurity: Not on file  Transportation Needs: Not on file  Physical Activity: Not on file  Stress: Not on file  Social Connections: Not on file  Intimate Partner Violence: Not on file   Family History  Problem Relation Age of Onset   Hypertension Mother    No Known Allergies    Review of Systems  Constitutional: Negative.   HENT: Negative.    Eyes: Negative.   Respiratory:  Negative for shortness of breath.   Cardiovascular:  Negative for chest pain.  Gastrointestinal: Negative.   Genitourinary: Negative.   Musculoskeletal:  Negative for back pain and joint pain.  Skin: Negative.   Neurological: Negative.   Endo/Heme/Allergies: Negative.   Psychiatric/Behavioral: Negative.        Objective:     BP 115/78 (BP  Location: Left Arm, Patient Position: Sitting, Cuff Size: Normal)   Pulse 66   Ht 5\' 8"  (1.727 m)   Wt 158 lb (71.7 kg)   LMP 05/31/2023   SpO2 97%   BMI 24.02 kg/m  BP Readings from Last 3 Encounters:  06/01/23 115/78  12/13/22 132/79  12/09/22 122/78   Wt Readings from Last 3 Encounters:  06/01/23 158 lb (71.7 kg)  12/13/22 165 lb (74.8 kg)  05/26/22 193 lb 6.4 oz (87.7 kg)      Physical Exam Vitals and nursing note reviewed.  Constitutional:      Appearance: Normal appearance.  HENT:     Head: Normocephalic and atraumatic.     Right Ear: External ear normal.     Left Ear: External ear normal.     Nose: Nose normal.     Mouth/Throat:     Mouth: Mucous membranes are moist.     Pharynx: Oropharynx is clear.  Eyes:     Extraocular Movements: Extraocular movements intact.     Conjunctiva/sclera: Conjunctivae normal.     Pupils: Pupils are equal, round, and reactive to light.  Cardiovascular:     Rate and Rhythm: Normal rate and regular rhythm.     Pulses: Normal pulses.     Heart sounds: Normal heart sounds.  Pulmonary:  Effort: Pulmonary effort is normal.     Breath sounds: Normal breath sounds.  Musculoskeletal:        General: Normal range of motion.     Cervical back: Normal range of motion and neck supple.  Skin:    General: Skin is warm and dry.  Neurological:     General: No focal deficit present.     Mental Status: She is alert and oriented to person, place, and time.  Psychiatric:        Mood and Affect: Mood normal.        Behavior: Behavior normal.        Thought Content: Thought content normal.        Judgment: Judgment normal.        Assessment & Plan:   Problem List Items Addressed This Visit   None Visit Diagnoses     Physical exam, pre-employment    -  Primary   History of anemia       Relevant Orders   CBC with Differential/Platelet   Iron, TIBC and Ferritin Panel     1. Physical exam, pre-employment Paperwork completed on  patient's behalf.  Patient education given on general health maintenance.  2. History of anemia Repeat labs completed today - CBC with Differential/Platelet - Iron, TIBC and Ferritin Panel    I have reviewed the patient's medical history (PMH, PSH, Social History, Family History, Medications, and allergies) , and have been updated if relevant. I spent 20 minutes reviewing chart and  face to face time with patient.    Return if symptoms worsen or fail to improve.    Kasandra Knudsen Mayers, PA-C

## 2023-06-28 ENCOUNTER — Ambulatory Visit: Payer: Medicaid Other | Admitting: Family Medicine

## 2023-08-11 DIAGNOSIS — G0439 Other acute necrotizing hemorrhagic encephalopathy: Secondary | ICD-10-CM | POA: Diagnosis not present

## 2023-08-11 DIAGNOSIS — F322 Major depressive disorder, single episode, severe without psychotic features: Secondary | ICD-10-CM | POA: Diagnosis not present

## 2023-08-11 DIAGNOSIS — R531 Weakness: Secondary | ICD-10-CM | POA: Diagnosis not present

## 2023-09-05 ENCOUNTER — Other Ambulatory Visit: Payer: Self-pay | Admitting: Obstetrics & Gynecology

## 2023-09-18 ENCOUNTER — Telehealth: Payer: Medicaid Other

## 2023-09-18 DIAGNOSIS — K0889 Other specified disorders of teeth and supporting structures: Secondary | ICD-10-CM

## 2023-09-18 MED ORDER — IBUPROFEN 800 MG PO TABS: 800.0000 mg | ORAL_TABLET | Freq: Three times a day (TID) | ORAL | 1 refills | Status: DC | PRN

## 2023-09-18 MED ORDER — CHLORHEXIDINE GLUCONATE 0.12 % MT SOLN: 15.0000 mL | Freq: Two times a day (BID) | OROMUCOSAL | 0 refills | Status: DC

## 2023-09-18 NOTE — Progress Notes (Signed)
Virtual Visit Consent   Madison Rocha, you are scheduled for a virtual visit with a [facility] provider today. Just as with appointments in the office, your consent must be obtained to participate. Your consent will be active for this visit and any virtual visit you may have with one of our providers in the next 365 days. If you have a MyChart account, a copy of this consent can be sent to you electronically.  As this is a virtual visit, video technology does not allow for your provider to perform a traditional examination. This may limit your provider's ability to fully assess your condition. If your provider identifies any concerns that need to be evaluated in person or the need to arrange testing (such as labs, EKG, etc.), we will make arrangements to do so. Although advances in technology are sophisticated, we cannot ensure that it will always work on either your end or our end. If the connection with a video visit is poor, the visit may have to be switched to a telephone visit. With either a video or telephone visit, we are not always able to ensure that we have a secure connection.  By engaging in this virtual visit, you consent to the provision of healthcare and authorize for your insurance to be billed (if applicable) for the services provided during this visit. Depending on your insurance coverage, you may receive a charge related to this service.  I need to obtain your verbal consent now. Are you willing to proceed with your visit today? [name] has provided verbal consent on 09/18/2023 for a virtual visit (video or telephone). Claiborne Rigg, NP  Date: 09/18/2023 9:30 AM  Virtual Visit via Video Note   I, Claiborne Rigg, connected with  Thema Holson  ([id number], 21-May-1996) on 09/18/23 at  9:30 AM EST by a video-enabled telemedicine application and verified that I am speaking with the correct person using two identifiers.  Location: Patient: Virtual  Visit Location Patient: Home Provider: Virtual Visit Location Provider: Home Office   I discussed the limitations of evaluation and management by telemedicine and the availability of in person appointments. The patient expressed understanding and agreed to proceed.    History of Present Illness: Madison Rocha is a 27 y.o. who identifies as a female who was assigned female at birth, and is being seen today for dental pain.   Ms. Mayford Knife notes persistent toothache of the right top molar and mild swelling of the gum around the tooth.  States she believes the tooth needs to be pulled and has significant decay.  Taking Aleve and applying oral anesthetic to the tooth and gumline with no relief of pain.  She denies fever or any symptoms of abscess.    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  Resting comfortably at home.  Head is normocephalic, atraumatic.  No labored breathing.  Speech is clear and coherent with logical content.  Patient is alert and oriented at baseline.    Assessment and Plan: 1. Pain, dental - ibuprofen (ADVIL) 800 MG tablet; Take 1 tablet (800 mg total) by mouth every 8 (eight) hours as needed.  Dispense: 60 tablet; Refill: 1 -  chlorhexidine (PERIDEX) 0.12 % solution; Use as directed 15 mLs in the mouth or throat 2 (two) times daily.  Dispense: 1000 mL; Refill: 0 Follow up with dentist as soon as possible  Follow Up Instructions: I discussed the assessment and treatment plan with the patient. The patient was provided an opportunity to ask questions and all were answered. The patient agreed with the plan and demonstrated an understanding of the instructions.  A copy of instructions were sent to the patient via MyChart unless otherwise noted below.    The patient was advised to call back or seek an in-person evaluation if the symptoms worsen or if the condition fails to improve as anticipated.    Claiborne Rigg, NP

## 2023-09-18 NOTE — Patient Instructions (Signed)
  Madison Rocha, thank you for joining Claiborne Rigg, NP for today's virtual visit.  While this provider is not your primary care provider (PCP), if your PCP is located in our provider database this encounter information will be shared with them immediately following your visit.   A [facility] MyChart account gives you access to today's visit and all your visits, tests, and labs performed at [facility] " click here if you don't have a [facility] MyChart account or go to [email or web address]  Consent: (Patient) Madison Rocha provided verbal consent for this virtual visit at the beginning of the encounter.  Current Medications:  Current Outpatient Medications:    chlorhexidine (PERIDEX) 0.12 % solution, Use as directed 15 mLs in the mouth or throat 2 (two) times daily., Disp: 1000 mL, Rfl: 0   ibuprofen (ADVIL) 800 MG tablet, Take 1 tablet (800 mg total) by mouth every 8 (eight) hours as needed., Disp: 60 tablet, Rfl: 1   Medications ordered in this encounter:  Meds ordered this encounter  Medications   ibuprofen (ADVIL) 800 MG tablet    Sig: Take 1 tablet (800 mg total) by mouth every 8 (eight) hours as needed.    Dispense:  60 tablet    Refill:  1    Order Specific Question:   Supervising Provider    Answer:   Merrilee Jansky [id number]   chlorhexidine (PERIDEX) 0.12 % solution    Sig: Use as directed 15 mLs in the mouth or throat 2 (two) times daily.    Dispense:  1000 mL    Refill:  0    Order Specific Question:   Supervising Provider    Answer:   Merrilee Jansky [[id number]]     *If you need refills on other medications prior to your next appointment, please contact your pharmacy*  Follow-Up: Call back or seek an in-person evaluation if the symptoms worsen or if the condition fails to improve as anticipated.  [facility] [phone]  Other Instructions May alternate ibuprofen with extra strength Tylenol every 4-6  hours Apply warm compresses to face for relief of pain  If you have been instructed to have an in-person evaluation today at a local Urgent Care facility, please use the link below. It will take you to a list of all of our available [facility], including address, phone number and hours of operation. Please do not delay care.  [facility]  If you or a family member do not have a primary care provider, use the link below to schedule a visit and establish care. When you choose a [facility] primary care physician or advanced practice provider, you gain a long-term partner in health. Find a Primary Care Provider  Learn more about [facility] in-office and virtual care options: [facility] - Get Care Now

## 2023-09-26 ENCOUNTER — Ambulatory Visit (HOSPITAL_BASED_OUTPATIENT_CLINIC_OR_DEPARTMENT_OTHER): Admit: 2023-09-26 | Payer: Medicaid Other | Admitting: Obstetrics & Gynecology

## 2023-09-26 SURGERY — LEEP (LOOP ELECTROSURGICAL EXCISION PROCEDURE)
Anesthesia: Monitor Anesthesia Care

## 2023-12-28 DIAGNOSIS — R87610 Atypical squamous cells of undetermined significance on cytologic smear of cervix (ASC-US): Secondary | ICD-10-CM | POA: Diagnosis not present

## 2023-12-28 DIAGNOSIS — Z0001 Encounter for general adult medical examination with abnormal findings: Secondary | ICD-10-CM | POA: Diagnosis not present

## 2023-12-28 DIAGNOSIS — R87810 Cervical high risk human papillomavirus (HPV) DNA test positive: Secondary | ICD-10-CM | POA: Diagnosis not present

## 2023-12-28 DIAGNOSIS — E559 Vitamin D deficiency, unspecified: Secondary | ICD-10-CM | POA: Diagnosis not present

## 2023-12-28 DIAGNOSIS — R87612 Low grade squamous intraepithelial lesion on cytologic smear of cervix (LGSIL): Secondary | ICD-10-CM | POA: Diagnosis not present

## 2023-12-28 DIAGNOSIS — R102 Pelvic and perineal pain: Secondary | ICD-10-CM | POA: Diagnosis not present

## 2023-12-28 DIAGNOSIS — Z113 Encounter for screening for infections with a predominantly sexual mode of transmission: Secondary | ICD-10-CM | POA: Diagnosis not present

## 2024-01-03 ENCOUNTER — Other Ambulatory Visit: Payer: Self-pay | Admitting: Oral Surgery

## 2024-01-03 DIAGNOSIS — M26623 Arthralgia of bilateral temporomandibular joint: Secondary | ICD-10-CM

## 2024-01-03 NOTE — Progress Notes (Signed)
  January 03, 2024  Madison Rocha, Madison Rocha  is a 28 yo who was referred by DDS for TMJ evaluation.   CC: Cant open mouth. Dental pain.  HPI: Had maxillary and mandibular orthognathic surgery 11 years ago in [place]. Has had limited opening since then. Recent onset dental pain.   Past Medical History: Maxillary and mandibula  Medications: None   Allergies: NKDA  Exam: BMI   No clicking, popping, or tenderness of the TMJ. The maximum opening was 10 mm, with 0 mm range of motion. The occlusion was Class I.  There was no crepitus present.Mallampati 1. Oral cancer screening negative. Pharynx clear. no lymphadenopathy  Pan: from 10/10/2023-condyles not visualized. Caries teeth # 3, 31.  Panorex re-taken  today: Right condyle: Large condylar head  seated in the fossa, unable to determine if ankylosis present. Left condyle: Smaller condylar head but with no condylar neck in fossa. Caries teeth # 3, 31.    Assessment: ASA  1. 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 Non-restorable teeth # 3, 31.  Plan: CT maxillofacial.   Rx: None  Georgia Lopes, DMD   M26.51 Abnormal jaw closure M26.52 Limited mandibular range of motion M26.613  Adhesions and ankylosis bilateral TMJ

## 2024-01-04 DIAGNOSIS — N760 Acute vaginitis: Secondary | ICD-10-CM | POA: Diagnosis not present

## 2024-01-04 DIAGNOSIS — N898 Other specified noninflammatory disorders of vagina: Secondary | ICD-10-CM | POA: Diagnosis not present

## 2024-01-04 DIAGNOSIS — Z3202 Encounter for pregnancy test, result negative: Secondary | ICD-10-CM | POA: Diagnosis not present

## 2024-01-10 ENCOUNTER — Ambulatory Visit (HOSPITAL_COMMUNITY)

## 2024-01-13 ENCOUNTER — Ambulatory Visit (HOSPITAL_COMMUNITY)
Admission: RE | Admit: 2024-01-13 | Discharge: 2024-01-13 | Disposition: A | Discharge status: Home / Self Care | Source: Ambulatory Visit | Attending: Oral Surgery | Admitting: Oral Surgery

## 2024-01-13 DIAGNOSIS — M26623 Arthralgia of bilateral temporomandibular joint: Secondary | ICD-10-CM | POA: Insufficient documentation

## 2024-01-13 DIAGNOSIS — J322 Chronic ethmoidal sinusitis: Secondary | ICD-10-CM | POA: Diagnosis not present

## 2024-01-13 DIAGNOSIS — M26653 Arthropathy of bilateral temporomandibular joint: Secondary | ICD-10-CM | POA: Diagnosis not present

## 2024-01-13 DIAGNOSIS — J323 Chronic sphenoidal sinusitis: Secondary | ICD-10-CM | POA: Diagnosis not present

## 2024-01-23 DIAGNOSIS — A5901 Trichomonal vulvovaginitis: Secondary | ICD-10-CM | POA: Diagnosis not present

## 2024-01-23 DIAGNOSIS — B3731 Acute candidiasis of vulva and vagina: Secondary | ICD-10-CM | POA: Diagnosis not present

## 2024-01-23 DIAGNOSIS — N898 Other specified noninflammatory disorders of vagina: Secondary | ICD-10-CM | POA: Diagnosis not present

## 2024-01-23 LAB — HM PAP SMEAR: HM Pap smear: ABNORMAL

## 2024-02-13 DIAGNOSIS — N870 Mild cervical dysplasia: Secondary | ICD-10-CM | POA: Diagnosis not present

## 2024-02-13 DIAGNOSIS — N871 Moderate cervical dysplasia: Secondary | ICD-10-CM | POA: Diagnosis not present

## 2024-02-13 DIAGNOSIS — R87810 Cervical high risk human papillomavirus (HPV) DNA test positive: Secondary | ICD-10-CM | POA: Diagnosis not present

## 2024-02-13 DIAGNOSIS — R87612 Low grade squamous intraepithelial lesion on cytologic smear of cervix (LGSIL): Secondary | ICD-10-CM | POA: Diagnosis not present

## 2024-02-17 DIAGNOSIS — M79604 Pain in right leg: Secondary | ICD-10-CM | POA: Diagnosis not present

## 2024-02-17 DIAGNOSIS — N8302 Follicular cyst of left ovary: Secondary | ICD-10-CM | POA: Diagnosis not present

## 2024-02-17 DIAGNOSIS — R102 Pelvic and perineal pain: Secondary | ICD-10-CM | POA: Diagnosis not present

## 2024-02-17 DIAGNOSIS — N871 Moderate cervical dysplasia: Secondary | ICD-10-CM | POA: Diagnosis not present

## 2024-02-22 NOTE — Therapy (Signed)
 OUTPATIENT PHYSICAL THERAPY LOWER EXTREMITY EVALUATION   Patient Name: Madison Rocha MRN: [id number] DOB:[birth year], 28 y.o., female Today's Date: 02/23/2024  END OF SESSION:  PT End of Session - 02/23/24 1322     Visit Number 1    Date for PT Re-Evaluation 04/19/24    Authorization Type [employer]-visits requested    PT Start Time 1238    PT Stop Time 1310    PT Time Calculation (min) 32 min    Activity Tolerance Patient tolerated treatment well    Behavior During Therapy [facility] for tasks assessed/performed             Past Medical History:  Diagnosis Date   Medical history non-contributory    Past Surgical History:  Procedure Laterality Date   MANDIBLE SURGERY  2011   Patient Active Problem List   Diagnosis Date Noted   Post-dates pregnancy 05/26/2022   Postpartum care following vaginal delivery 8/26 06/20/2020   Perineal laceration with delivery, first degree 06/20/2020   Maternal anemia, with delivery - IDA with superimposed ABL anemia 06/20/2020   SVD (spontaneous vaginal delivery) 06/19/2020     REFERRING PROVIDER: Vernal Gold  REFERRING DIAG:  Diagnosis  M79.604 (ICD-10-CM) - Pain in right leg    THERAPY DIAG:  Pain in right leg - Plan: PT plan of care cert/re-cert  Muscle weakness (generalized) - Plan: PT plan of care cert/re-cert  Cramp and spasm - Plan: PT plan of care cert/re-cert  Rationale for Evaluation and Treatment: Rehabilitation  ONSET DATE: 3 year history, when pregnant  SUBJECTIVE:   SUBJECTIVE STATEMENT: Pt presents to PT with Rt LE pain. She reports that pain began 3 years ago when she was pregnant.  She has had another baby since this time.  Her children ar 7, 3 and 1.  She has not had any imaging.    PERTINENT HISTORY: 3 pregnancies: most recent delivery 2023  PAIN: 02/23/24 Are you having pain? Yes: NPRS scale: 7/10 Pain location: front of Rt leg Pain description: aching, throbbing, tingling Aggravating factors:  unsure, it just hurts and is random Relieving factors: nothing, sometimes massage and stretching   PRECAUTIONS: None  RED FLAGS: None   WEIGHT BEARING RESTRICTIONS: No  FALLS:  Has patient fallen in last 6 months? 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  DIAGNOSTIC FINDINGS:  None   PATIENT SURVEYS:  02/23/24: LEFS 43/80=53%   COGNITION: Overall cognitive status: Within functional limits for tasks assessed     SENSATION: WFL    POSTURE: rounded shoulders and posterior pelvic tilt  PALPATION: Palpable tenderness over Rt quad, no tenderness in gluteals, hamstrings or adductors   LOWER EXTREMITY ROM:  Rt=Lt and WFLs  LOWER EXTREMITY MMT:  Rt hip: flexion 3/5, abduction 4/5, extension 4+/5.  Rt knee 4/5.  Lt hip and knee: 4+/5  LOWER EXTREMITY SPECIAL TESTS:  Repeated lumbar extension and flexion: no preference or impact on leg pain  GAIT: Distance walked: 50 Assistive device utilized: None Level of assistance: Complete Independence Comments: genu recurvatum bilaterally, pelvic instability  TREATMENT DATE:  02/23/24:  Findings from evaluation discussed, pt educated on plan of care, HEP initiated.     If treatment provided at initial evaluation, no treatment charged due to lack of authorization.        PATIENT EDUCATION:  Education details: Access Code:  [email or web address] Person educated: Patient Education method: Explanation, Demonstration, and Handouts Education comprehension: verbalized understanding and returned demonstration  HOME EXERCISE PROGRAM: Access Code: [email or web address] URL: [email or web address] Date: 02/23/2024 Prepared by: Loetta Ringer  Exercises - Hooklying Single Knee to Chest  - 3 x daily - 7 x weekly - 1 sets - 3 reps - 20 hold - Seated Long Arc Quad  - 3 x daily - 7 x weekly - 2 sets - 10 reps - Seated Hamstring Stretch  - 3 x daily - 7 x weekly - 1 sets - 3 reps - 20 hold - Hip Flexor Stretch on Step  - 3 x daily - 7 x weekly - 1 sets - 3 reps - 20 hold  ASSESSMENT:  CLINICAL IMPRESSION: Patient is a 28 y.o. female who was seen today for physical therapy evaluation and treatment for Rt LE pain. Pt reports that she began to have pain down the Rt LE when she was pregnant 3 years ago.  She continued to have pain during another pregnancy after that.  No imaging has been done.  Pt with significant genurecurvatum bilaterally in standing.  Rt LE weakness and palpable tenderness.  No impact on pain with repetitive lumbar flexion or extension.  Pt reports constant 7/10 pain into the leg and she has difficulty falling asleep at times.  Patient will benefit from skilled PT to address the below impairments and improve overall function to care for her children ages 77, 48 and 1.   OBJECTIVE IMPAIRMENTS: difficulty walking, decreased strength, increased muscle spasms, impaired flexibility, postural dysfunction, and pain.   ACTIVITY LIMITATIONS: squatting, sleeping, stairs, locomotion level, and caring for others  PARTICIPATION LIMITATIONS: cleaning, laundry, shopping, and community activity  PERSONAL FACTORS: Time since onset of injury/illness/exacerbation are also affecting patient's functional outcome.   REHAB POTENTIAL: Good  CLINICAL DECISION MAKING: Stable/uncomplicated  EVALUATION COMPLEXITY: Low   GOALS: Goals reviewed with  patient? Yes  SHORT TERM GOALS: Target date: 03/22/2024   Be independent in initial HEP Baseline: Goal status: INITIAL  2.  Report Rt LE pain is intermittent with daily tasks  Baseline:  Goal status: INITIAL  3.  Demonstrate Rt SLR x 10 without lag to improve functional LE endurance  Baseline:  Goal status: INITIAL  4.  Improve LEFS to > or = to 50/80 to improve function Baseline:  Goal status: INITIAL    LONG TERM GOALS: Target date: 04/19/2024    Be independent in advanced HEP Baseline:  Goal status: INITIAL  2.  Improve LEFS to > or = to 55/80 Baseline: 43/80 Goal status: INITIAL  3.  Report < or = to 4/10 Rt LE with daily tasks and care of her children Baseline:  Goal status: INITIAL  4.  Fall asleep without limitation due to Rt LE pain Baseline:  Goal status: INITIAL     PLAN:  PT FREQUENCY: 2x/week  PT DURATION: 8 weeks  PLANNED INTERVENTIONS: 97110-Therapeutic exercises, 97530- Therapeutic activity, [id number]- Neuromuscular re-education, 97535- Self Care, 40981- Manual therapy, [phone]- Gait training, [phone]- Canalith repositioning, [id number]- Aquatic Therapy, [phone]- Electrical stimulation (manual), [id number]- Traction (mechanical), [id number]- Ionotophoresis 4mg /ml Dexamethasone, Patient/Family education, Balance training, Stair training, Taping, Dry Needling, Joint mobilization, Joint manipulation, Spinal manipulation,  Spinal mobilization, Vestibular training, and Cryotherapy  PLAN FOR NEXT SESSION: Rt LE flexibility and strength, quad stretch, dry needling to Rt quad    Luella Sager, PT 02/23/24 1:24 PM   [facility] [address], [address] [place], [place] [address] Phone # [phone] Fax [phone]

## 2024-02-23 ENCOUNTER — Ambulatory Visit: Attending: Obstetrics & Gynecology

## 2024-02-23 ENCOUNTER — Other Ambulatory Visit: Payer: Self-pay

## 2024-02-23 DIAGNOSIS — R262 Difficulty in walking, not elsewhere classified: Secondary | ICD-10-CM | POA: Diagnosis not present

## 2024-02-23 DIAGNOSIS — M79604 Pain in right leg: Secondary | ICD-10-CM | POA: Insufficient documentation

## 2024-02-23 DIAGNOSIS — R252 Cramp and spasm: Secondary | ICD-10-CM | POA: Insufficient documentation

## 2024-02-23 DIAGNOSIS — M6281 Muscle weakness (generalized): Secondary | ICD-10-CM | POA: Insufficient documentation

## 2024-03-01 ENCOUNTER — Ambulatory Visit

## 2024-03-01 DIAGNOSIS — M6281 Muscle weakness (generalized): Secondary | ICD-10-CM | POA: Diagnosis not present

## 2024-03-01 DIAGNOSIS — R252 Cramp and spasm: Secondary | ICD-10-CM

## 2024-03-01 DIAGNOSIS — R262 Difficulty in walking, not elsewhere classified: Secondary | ICD-10-CM

## 2024-03-01 DIAGNOSIS — M79604 Pain in right leg: Secondary | ICD-10-CM

## 2024-03-01 NOTE — Therapy (Signed)
 OUTPATIENT PHYSICAL THERAPY LOWER EXTREMITY EVALUATION   Patient Name: Madison Rocha MRN: [id number] DOB:1995-11-18, 28 y.o., female Today's Date: 03/01/2024  END OF SESSION:  PT End of Session - 03/01/24 1150     Visit Number 2    Date for PT Re-Evaluation 04/19/24    Authorization Type [employer]-visits requested    PT Start Time 1150    PT Stop Time 1230    PT Time Calculation (min) 40 min    Activity Tolerance Patient tolerated treatment well    Behavior During Therapy [facility] for tasks assessed/performed             Past Medical History:  Diagnosis Date   Medical history non-contributory    Past Surgical History:  Procedure Laterality Date   MANDIBLE SURGERY  2011   Patient Active Problem List   Diagnosis Date Noted   Post-dates pregnancy 05/26/2022   Postpartum care following vaginal delivery 8/26 06/20/2020   Perineal laceration with delivery, first degree 06/20/2020   Maternal anemia, with delivery - IDA with superimposed ABL anemia 06/20/2020   SVD (spontaneous vaginal delivery) 06/19/2020     REFERRING PROVIDER: Vernal Rocha  REFERRING DIAG:  Diagnosis  M79.604 (ICD-10-CM) - Pain in right leg    THERAPY DIAG:  Pain in right leg  Muscle weakness (generalized)  Difficulty in walking, not elsewhere classified  Cramp and spasm  Rationale for Evaluation and Treatment: Rehabilitation  ONSET DATE: 3 year history, when pregnant  SUBJECTIVE:   SUBJECTIVE STATEMENT: Patient reports she is a little better but still having pain.  She does rate her pain lower at 3/10.      PERTINENT HISTORY: 3 pregnancies: most recent delivery 2023  PAIN: 03/01/24 Are you having pain? Yes: NPRS scale: 3/10 Pain location: front of Rt leg Pain description: aching, throbbing, tingling Aggravating factors: unsure, it just hurts and is random Relieving factors: nothing, sometimes massage and stretching   PRECAUTIONS: None  RED FLAGS: None   WEIGHT BEARING  RESTRICTIONS: No  FALLS:  Has patient fallen in last 6 months? 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  DIAGNOSTIC FINDINGS:  None   PATIENT SURVEYS:  02/23/24: LEFS 43/80=53%   COGNITION: Overall cognitive status: Within functional limits for tasks assessed     SENSATION: WFL    POSTURE: rounded shoulders and posterior pelvic tilt  PALPATION: Palpable tenderness over Rt quad, no tenderness in gluteals, hamstrings or adductors   LOWER EXTREMITY ROM:  Rt=Lt and WFLs  LOWER EXTREMITY MMT:  Rt hip: flexion 3/5, abduction 4/5, Rocha 4+/5.  Rt knee 4/5.  Lt hip and knee: 4+/5  LOWER EXTREMITY SPECIAL TESTS:  Repeated lumbar Rocha and flexion: no preference or impact on leg pain  GAIT: Distance walked: 50 Assistive device utilized: None Level of assistance: Complete Independence Comments: genu recurvatum bilaterally, pelvic instability  TREATMENT DATE:  03/01/24:  Madison Rocha x 5 min, level 5 (PT present to discuss status) Prone lying x 2 min Prone on elbows x 2 min Prone press ups x 10 Standing hamstring stretch 3 x 30 sec Standing quad / hip flexor stretch 3 x 30 sec Hook lying PPT x20 PPT with 90/90 heel taps x 20 PPT with dying bug x 20 Ice to lumbar spine x 10 min at end of session.  02/23/24:  Findings from evaluation discussed, pt educated on plan of care,  HEP initiated.     If treatment provided at initial evaluation, no treatment charged due to lack of authorization.        PATIENT EDUCATION:  Education details: Access Code: [email or web address] Person educated: Patient Education method: Explanation, Demonstration, and Handouts Education comprehension: verbalized understanding and returned demonstration  HOME EXERCISE PROGRAM:   ASSESSMENT:  CLINICAL IMPRESSION: Madison Rocha was able to complete all tasks.  We tried  Madison Rocha progression today to rule out lumbar spine radiculopathy.  She had some tingling and aching in the leg prior to prone progression.  These symptoms dissipated with the prone progression.   Her pain is somewhat vague.  Unsure if this is due to a knee issue or lumbar spine issue.   Patient will benefit from skilled PT to address the below impairments and improve overall function to care for her children ages 46, 14 and 1.   OBJECTIVE IMPAIRMENTS: difficulty walking, decreased strength, increased muscle spasms, impaired flexibility, postural dysfunction, and pain.   ACTIVITY LIMITATIONS: squatting, sleeping, stairs, locomotion level, and caring for others  PARTICIPATION LIMITATIONS: cleaning, laundry, shopping, and community activity  PERSONAL FACTORS: Time since onset of injury/illness/exacerbation are also affecting patient's functional outcome.   REHAB POTENTIAL: Good  CLINICAL DECISION MAKING: Stable/uncomplicated  EVALUATION COMPLEXITY: Low   GOALS: Goals reviewed with patient? Yes  SHORT TERM GOALS: Target date: 03/22/2024   Be independent in initial HEP Baseline: Goal status: INITIAL  2.  Report Rt LE pain is intermittent with daily tasks  Baseline:  Goal status: INITIAL  3.  Demonstrate Rt SLR x 10 without lag to improve functional LE endurance  Baseline:  Goal status: INITIAL  4.  Improve LEFS to > or = to 50/80 to improve function Baseline:  Goal status: INITIAL    LONG TERM GOALS: Target  date: 04/19/2024    Be independent in advanced HEP Baseline:  Goal status: INITIAL  2.  Improve LEFS to > or = to 55/80 Baseline: 43/80 Goal status: INITIAL  3.  Report < or = to 4/10 Rt LE with daily tasks and care of her children Baseline:  Goal status: INITIAL  4.  Fall asleep without limitation due to Rt LE pain Baseline:  Goal status: INITIAL     PLAN:  PT FREQUENCY: 2x/week  PT DURATION: 8 weeks  PLANNED INTERVENTIONS: 97110-Therapeutic exercises, 97530- Therapeutic activity, [id number]- Neuromuscular re-education, 97535- Self Care, [id number]- Manual therapy, [phone]- Gait training, [phone]- Canalith repositioning, [id number]- Aquatic Therapy, [phone]- Electrical stimulation (manual), [id number]- Traction (mechanical), [id number]- Ionotophoresis 4mg /ml Dexamethasone, Patient/Family education, Balance training, Stair training, Taping, Dry Needling, Joint mobilization, Joint manipulation, Spinal manipulation, Spinal mobilization, Vestibular training, and Cryotherapy  PLAN FOR NEXT SESSION: Madison Rocha eliminated her leg symptoms,  monitor symptoms for mechanical knee pain vs lumbar radiculopathy,  Rt LE flexibility and strength, quad stretch, dry needling to Rt quad    Gauri Galvao B. Allister Lessley, PT 03/01/24 4:21 PM [facility] [address] [place],  [place] [address] Phone # [phone] Fax [phone]

## 2024-03-05 ENCOUNTER — Ambulatory Visit

## 2024-03-13 ENCOUNTER — Ambulatory Visit

## 2024-03-13 ENCOUNTER — Telehealth: Payer: Self-pay

## 2024-03-13 NOTE — Telephone Encounter (Signed)
 PT called pt due to no-show today.  Left voicemail for pt to call back.

## 2024-03-15 ENCOUNTER — Telehealth: Admitting: Emergency Medicine

## 2024-03-15 VITALS — BP 124/78 | HR 58 | Resp 16 | Ht 67.72 in

## 2024-03-15 DIAGNOSIS — R079 Chest pain, unspecified: Secondary | ICD-10-CM

## 2024-03-15 DIAGNOSIS — F341 Dysthymic disorder: Secondary | ICD-10-CM | POA: Diagnosis not present

## 2024-03-15 DIAGNOSIS — R519 Headache, unspecified: Secondary | ICD-10-CM | POA: Diagnosis not present

## 2024-03-15 DIAGNOSIS — R5383 Other fatigue: Secondary | ICD-10-CM | POA: Diagnosis not present

## 2024-03-15 NOTE — Progress Notes (Unsigned)
 Virtual Primary Care Telehealth Visit  Virtual Visit Consent  [name], you are scheduled for a virtual visit with a [facility] provider today. Just as with appointments in the office, your consent must be obtained to participate. Your consent will be active for this visit and any virtual visit you may have with one of our providers in the next 365 days. If you have a MyChart account, a copy of this consent can be sent to you electronically.  By engaging in this virtual visit, you consent to the provision of healthcare and authorize for your insurance to be billed (if applicable) for the services provided during this visit. Depending on your insurance coverage, you may receive a charge related to this service.  I need to obtain your verbal consent now. Are you willing to proceed with your visit today? [name] has provided verbal consent on 03/20/2024 for a virtual visit (via Tytoc[employer]). Blinda Burger, NP  Date: 03/20/2024 10:01 AM  Virtual Visit via Video Note   I, Blinda Burger, connected with  Madison Rocha  ([id number], 12-16-1995) on 03/20/24 at 10:00 AM EDT by a video-enabled telemedicine application and verified that I am speaking with the correct person using two identifiers.  Telepresenter, Marguerite Shiley, present for entirety of visit to assist with video functionality and physical examination via TytoCare device.   This is an initial visit to discuss the opportunity to become a primary care patient at [facility]  The patient understands that if their medical background is complex, their case will be reviewed with the Medical Director, and if Virtual Primary Care is not the ideal location for their care, our team will help establish the patient with a primary care provider in the area.   If this is determined that this location is not the best option for the patient, in the future if the patient's medical condition changes we can re explore the  option of this location serving as their primary care location.  The patient understands that by becoming a primary care patient, this would be the location for their primary care, and if they chose to leave this location and seek primary care services at another location they will not be able to continue their relationship with this clinic.    Location: Patient: Virtual Visit Location Patient: V[facility] visit location: [facility]  Provider: Virtual Visit Location Provider: Home Office   History of Present Illness: Madison Rocha is a 28 y.o. who identifies as a female who was assigned female at birth, and is being seen today as a new patient with the [facility].   Weak, chest pain, headache x2 months Leg pain with PT  Cp comes and goes - used to see cards and had stress test annually for irregular HR (skips a beat) and heart murmur since she was little - cp whole life, sx different for last 2 months. Gets SOB has to rest and it will somewhat get better. Can happen when at rest. Has chest tightness. Lasts 5 min. Not every day ~2x/week. Gets SOB when walking up stairs at home. ROS dizzy and weak. No nausea, no syncope  Weak - Legs go numb, can't walk - legs give out - has not told this to PT. Sometimes legs feel like they are asleep. Also has back pain neck in the middle and low back across whole back. No injuries. No BUE numbness or weakness.   Wake up with HA sometimes. Frontal. No improve with eat  or driking water. Drinks 64oz+ water day. Daily headaches x2 months. Has new glasses, can see well, no change in ha with glasses. No vision changes with Ha. Lights bother her with ha. Doesn't try medicine. Goes away during course of day. No seasonal allergies. No alcohol, rare caffeine   Do you have a current Primary Care Provider? No Have you seen a Primary Care Provider in the past? No Do you live in [facility]? No Do you live in the [facility]? No  Do you  have a history of Diabetes No Do you have a history of HTN No Do you have a history of high cholesterol? No Have you been diagnosed with kidney disease or kidney failure in the past? No Have you been diagnosed with an autoimmune disease? No Have you been diagnosed with sleep apnea? No Have you been diagnosed with Asthma or COPD? No Do you have a history of tobacco use? No Have you ever suffered a stroke or TIA? No Have you ever had a seizure? No Have you been diagnosed with ADHD? No  Have you been diagnosed with anxiety or depression? Yes 2 years ago dx - sad, crying, no interest in activities. Lost brother 2 years ago. Has been working on herself. Had life event - lost someone close to her.   Has 3 kids: 7, 3, 1.  Not good sleep - not enough - irregular  Have you had an organ transplant in the past No  Do you have any prescriptions for controlled medications No  Do you see any specialists currently No  Have you had surgery in the past Yes Jaw surgery- braces, teeth growing - can't open her mouth well Hernia surgery  - umbilical - as a child  Do you take any hormone replacement therapy No   HPI: HPI  Problems:  Patient Active Problem List   Diagnosis Date Noted   Post-dates pregnancy 05/26/2022   Postpartum care following vaginal delivery 8/26 06/20/2020   Perineal laceration with delivery, first degree 06/20/2020   Maternal anemia, 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  Observations/Objective: Physical Exam Constitutional:      Appearance: Normal appearance. She is not ill-appearing.  HENT:     Mouth/Throat:     Comments: Only able to open mouth about 2 inches Cardiovascular:     Rate and Rhythm: Normal rate and regular rhythm.     Heart sounds: No murmur heard. Pulmonary:     Effort: Pulmonary effort is  normal.     Breath sounds: Normal breath sounds.  Neurological:     Mental Status: She is alert.  Psychiatric:        Mood and Affect: Mood normal.        Behavior: Behavior normal.        Thought Content: Thought content normal. Thought content does not include homicidal or suicidal ideation.        Judgment: Judgment normal.     Assessment and Plan: 1. Fatigue, unspecified type (Primary) - TSH - CBC - Comprehensive metabolic panel with GFR  2. Persistent depressive disorder - Ambulatory referral to Behavioral Health  3. Chest pain, unspecified type - Ambulatory referral to Cardiology  4. Acute nonintractable headache, unspecified headache type  No concerning findings on labwork that would explain her fatigue. Given her depressive symptoms- sad, poor sleep, no interest in  activities, recent loss- she may be feeling tired due to depression. Referral to [facility] placed.   Can't open mouth, will be difficult intubation if ever needs intubation - needs maxillofacial eval - start with dentist  Chest pain. Sounds atypical. Also has some kind of cardiac hx of murmur and possible irregular heart beat (skipped beats). Refer to cardiology for eval. She can't remember the name of the cardiology office she used to see in [place] up until she moved 5 years ago. I'm not sure we will be able to get records.   Frequent headaches. I did not have an opportunity to discuss this sufficiently with her. Will have her return to talk about headaches.    Follow Up Instructions: I discussed the assessment and treatment plan with the patient. The Telepresenter provided patient with a physical copy of my written instructions for review.   The patient was advised to call back or seek an in-person evaluation if the symptoms worsen or if the condition fails to improve as anticipated.    Blinda Burger, NP  **Disclaimer: This note may have been dictated with voice recognition software. Similar sounding words can  inadvertently be transcribed and this note may contain transcription errors which may not have been corrected upon publication of note.**

## 2024-03-15 NOTE — Progress Notes (Unsigned)
 Pt presents to establish care -has frequent headaches that is alleviated by sleep

## 2024-03-15 NOTE — Progress Notes (Unsigned)
 The patient presented for a primary care visit on 03/15/24 to establish care. Blood pressure screening was conducted, and the result was 124/78. During the appointment, the patient reported having a transportation SDOH insecurity. As a precaution, the patient was provided with transportation SDOH resources.  A review of the patient's chart revealed that they do not currently have a primary care provider (PCP) and no future appointments were indicated. An additional follow up will be done according to the health equity team's protocol.

## 2024-03-16 LAB — COMPREHENSIVE METABOLIC PANEL WITH GFR
ALT: 9 IU/L (ref 0–32)
AST: 12 IU/L (ref 0–40)
Albumin: 4.5 g/dL (ref 4.0–5.0)
Alkaline Phosphatase: 57 IU/L (ref 44–121)
BUN/Creatinine Ratio: 14 (ref 9–23)
BUN: 9 mg/dL (ref 6–20)
Bilirubin Total: 0.9 mg/dL (ref 0.0–1.2)
CO2: 21 mmol/L (ref 20–29)
Calcium: 9.4 mg/dL (ref 8.7–10.2)
Chloride: 103 mmol/L (ref 96–106)
Creatinine, Ser: 0.66 mg/dL (ref 0.57–1.00)
Globulin, Total: 2.8 g/dL (ref 1.5–4.5)
Glucose: 74 mg/dL (ref 70–99)
Potassium: 4.2 mmol/L (ref 3.5–5.2)
Sodium: 137 mmol/L (ref 134–144)
Total Protein: 7.3 g/dL (ref 6.0–8.5)
eGFR: 122 mL/min/{1.73_m2} (ref >59)

## 2024-03-16 LAB — CBC
Hematocrit: 37.1 % (ref 34.0–46.6)
Hemoglobin: 11.5 g/dL (ref 11.1–15.9)
MCH: 28.9 pg (ref 26.6–33.0)
MCHC: 31 g/dL — ABNORMAL LOW (ref 31.5–35.7)
MCV: 93 fL (ref 79–97)
Platelets: 252 10*3/uL (ref 150–450)
RBC: 3.98 x10E6/uL (ref 3.77–5.28)
RDW: 12.5 % (ref 11.7–15.4)
WBC: 4.2 10*3/uL (ref 3.4–10.8)

## 2024-03-16 LAB — TSH: TSH: 1.22 u[IU]/mL (ref 0.450–4.500)

## 2024-03-20 ENCOUNTER — Ambulatory Visit

## 2024-03-22 ENCOUNTER — Ambulatory Visit

## 2024-03-27 ENCOUNTER — Ambulatory Visit

## 2024-03-27 DIAGNOSIS — H6123 Impacted cerumen, bilateral: Secondary | ICD-10-CM | POA: Diagnosis not present

## 2024-04-03 ENCOUNTER — Encounter

## 2024-04-10 ENCOUNTER — Encounter

## 2024-04-17 ENCOUNTER — Encounter

## 2024-04-26 DIAGNOSIS — E559 Vitamin D deficiency, unspecified: Secondary | ICD-10-CM | POA: Diagnosis not present

## 2024-07-05 NOTE — Progress Notes (Signed)
 Cardiology Office Note:   Date:  07/09/2024  ID:  Madison Rocha, DOB 07-26-1996, MRN [id number] PCP:  Patient, No Pcp Per  [facility] Providers Cardiologist:  Wendel Haws, MD Referring MD: Richad Jon HERO, NP  Chief Complaint/Reason for Referral: Chest pain ASSESSMENT:    1. Precordial pain     PLAN:   In order of problems listed above: Chest pain:  We will obtain a coronary CTA and echocardiogram to evaluate further.  If the patient has mild obstructive coronary artery disease, they will require a statin (with goal LDL < 70) and aspirin, if they have high-grade disease we will need to consider optimal medical therapy and if symptoms are refractory to medical therapy, then a cardiac catheterization with possible PCI will be pursued to alleviate symptoms.  If they have high risk disease we will proceed directly to cardiac catheterization.               Dispo:  Return if symptoms worsen or fail to improve.       I spent 35 minutes reviewing all clinical data during and prior to this visit including all relevant imaging studies, laboratories, clinical information from other health systems and prior notes from both Cardiology and other specialties, interviewing the patient, conducting a complete physical examination, and coordinating care in order to formulate a comprehensive and personalized evaluation and treatment plan.   History of Present Illness:    FOCUSED PROBLEM LIST:   BMI 18 July 2024:  Patient consents to use of AI scribe. The patient is a 28 year old female with no significant medical history was referred for conditions regarding chest pain.  The patient was seen by her primary care provider in May with complaints of chest discomfort and frequent headaches.  She experiences chest pain that sometimes radiates and causes a tingling sensation. The pain occurs even at rest and is not exacerbated by pressing on the chest. She has a history of similar  symptoms and previously underwent annual stress tests in [place] , all of which were normal.  She was having headaches at her primary care provider's office back in May, but no further details are available.  She occasionally experiences lightheadedness, particularly when bending down, but denies any episodes of syncope. No swelling in her legs is noted.  She denies any history of diabetes and is not currently on any medications. She does not smoke and maintains a diet with regular food and water intake. 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     Review of Systems:   Please see the history of present illness.    All other systems reviewed and are negative.     EKGs/Labs/Other Test Reviewed:   EKG:    EKG Interpretation Date/Time:  Monday July 09 2024 13:36:02 EDT Ventricular Rate:  56 PR Interval:  148 QRS Duration:  86 QT Interval:  432 QTC Calculation: 416 R Axis:   64  Text Interpretation: Sinus bradycardia No previous ECGs available Confirmed by Wendel Haws ([id number]) on 07/09/2024 2:05:36 PM        CARDIAC STUDIES: Refer to CV Procedures and Imaging Tabs   Risk Assessment/Calculations:          Physical Exam:   VS:  BP 122/78   Pulse 62   Ht 5' 9 (1.753 m)   Wt 153 lb  6.4 oz (69.6 kg)   SpO2 99%   BMI 22.65 kg/m        Wt Readings from Last 3 Encounters:  07/09/24 153 lb 6.4 oz (69.6 kg)  06/01/23 158 lb (71.7 kg)  12/13/22 165 lb (74.8 kg)      GENERAL:  No apparent distress, AOx3 HEENT:  No carotid bruits, +2 carotid impulses, no scleral icterus CAR: RRR no murmurs, gallops, rubs, or thrills RES:  Clear to auscultation bilaterally ABD:  Soft, nontender, nondistended, positive bowel sounds x 4 VASC:  +2 radial pulses, +2 carotid pulses NEURO:  CN 2-12 grossly intact; motor and sensory grossly  intact PSYCH:  No active depression or anxiety EXT:  No edema, ecchymosis, or cyanosis  Signed, Child Campoy K Mersadez Linden, MD  07/09/2024 2:13 PM    [facility] [address] [place], [place], [place]  [address] Phone: [phone]; Fax: [phone]   Note:  This document was prepared using Dragon voice recognition software and may include unintentional dictation errors.

## 2024-07-09 ENCOUNTER — Ambulatory Visit: Attending: Internal Medicine | Admitting: Internal Medicine

## 2024-07-09 ENCOUNTER — Encounter: Payer: Self-pay | Admitting: Internal Medicine

## 2024-07-09 VITALS — BP 122/78 | HR 62 | Ht 69.0 in | Wt 153.4 lb

## 2024-07-09 DIAGNOSIS — R072 Precordial pain: Secondary | ICD-10-CM | POA: Diagnosis not present

## 2024-07-09 DIAGNOSIS — Z01812 Encounter for preprocedural laboratory examination: Secondary | ICD-10-CM | POA: Insufficient documentation

## 2024-07-09 MED ORDER — METOPROLOL TARTRATE 50 MG PO TABS: 50.0000 mg | ORAL_TABLET | Freq: Once | ORAL | 0 refills | Status: DC

## 2024-07-09 NOTE — Patient Instructions (Addendum)
 Medication Instructions:  ONCE Metoprolol  Tartrate (Lopressor ) 50 mg to be take 2 hours prior to coronary CTA scan  *If you need a refill on your cardiac medications before your next appointment, please call your pharmacy*  Lab Work: Bmet today If you have labs (blood work) drawn today and your tests are completely normal, you will receive your results only by: MyChart Message (if you have MyChart) OR A paper copy in the mail If you have any lab test that is abnormal or we need to change your treatment, we will call you to review the results.  Testing/Procedures: Non-Cardiac CT Angiography (CTA), is a special type of CT scan that uses a computer to produce multi-dimensional views of major blood vessels throughout the body. In CT angiography, a contrast material is injected through an IV to help visualize the blood vessels  Your physician has requested that you have an echocardiogram. Echocardiography is a painless test that uses sound waves to create images of your heart. It provides your doctor with information about the size and shape of your heart and how well your heart's chambers and valves are working. This procedure takes approximately one hour. There are no restrictions for this procedure. Please do NOT wear cologne, perfume, aftershave, or lotions (deodorant is allowed). Please arrive 15 minutes prior to your appointment time.  Please note: We ask at that you not bring children with you during ultrasound (echo/ vascular) testing. Due to room size and safety concerns, children are not allowed in the ultrasound rooms during exams. Our front office staff cannot provide observation of children in our lobby area while testing is being conducted. An adult accompanying a patient to their appointment will only be allowed in the ultrasound room at the discretion of the ultrasound technician under special circumstances. We apologize for any inconvenience.   Follow-Up: At [facility],  you and your health needs are our priority.  As part of our continuing mission to provide you with exceptional heart care, our providers are all part of one team.  This team includes your primary Cardiologist (physician) and Advanced Practice Providers or APPs (Physician Assistants and Nurse Practitioners) who all work together to provide you with the care you need, when you need it.  Your next appointment:   As needed  Provider:   Lurena Red, MD    We recommend signing up for the patient portal called MyChart.  Sign up information is provided on this After Visit Summary.  MyChart is used to connect with patients for Virtual Visits (Telemedicine).  Patients are able to view lab/test results, encounter notes, upcoming appointments, etc.  Non-urgent messages can be sent to your provider as well.   To learn more about what you can do with MyChart, go to [email or web address].       Your cardiac CT will be scheduled at one of the below locations:   [facility] [address] [place], [place] [address] [phone] (Severe contrast allergies only)  OR   [facility] [address] [place], [place] [address] [phone]  OR   [facility] [address] [place], [place] [address] [phone]  OR   [facility] [address]  [place], [place] [address] [phone]  OR   [facility] [address] [place], [place] [phone]  If scheduled at [facility], please arrive at the [facility] and Children's Entrance (Entrance C2) of [facility] 30  minutes prior to test start time. You can use the FREE valet parking offered at entrance C (encouraged to control the heart rate for the test)  Proceed to the [facility] Radiology Department (first floor) to check-in and test prep.  All radiology patients and guests should use entrance C2 at [facility], accessed  from [facility], even though the hospital's physical address listed is [address]  If scheduled at the Heart and Vascular Tower at [address], please enter the parking lot using the [address] entrance and use the FREE valet service at the patient drop-off area. Enter the building and check-in with registration on the main floor.  If scheduled at [facility], please arrive to the Heart and Vascular Center 15 mins early for check-in and test prep.  There is spacious parking and easy access to the radiology department from the [facility] Heart and Vascular entrance. Please enter here and check-in with the desk attendant.   If scheduled at [facility], please arrive 30 minutes early for check-in and test prep.  Please follow these instructions carefully (unless otherwise directed):  An IV will be required for this test and Nitroglycerin  will be given.    On the Night Before the Test: Be sure to Drink plenty of water. Do not consume any caffeinated/decaffeinated beverages or chocolate 12 hours prior to your test. Do not take any antihistamines 12 hours prior to your test.    On the Day of the Test: Drink plenty of water until 1 hour prior to the test. Do not eat any food 1 hour prior to test. You may take your regular medications prior to the test.  Take metoprolol  50 mg two hours prior to test FEMALES- please wear underwire-free bra if available, avoid dresses & tight clothing        After the Test: Drink plenty of water. After receiving IV contrast, you may experience a mild flushed feeling. This is normal. On occasion, you may experience a mild rash up to 24 hours after the test. This is not dangerous. If this occurs, you can take Benadryl  25 mg, Zyrtec, Claritin, or Allegra and increase your fluid intake. (Patients taking Tikosyn should avoid Benadryl , and may take Zyrtec, Claritin, or Allegra) If you experience trouble  breathing, this can be serious. If it is severe call 911 IMMEDIATELY. If it is mild, please call our office.  We will call to schedule your test 2-4 weeks out understanding that some insurance companies will need an authorization prior to the service being performed.   For more information and frequently asked questions, please visit our website : [email or web address]  For non-scheduling related questions, please contact the cardiac imaging nurse navigator should you have any questions/concerns: Cardiac Imaging Nurse Navigators Direct Office Dial: [phone]   For scheduling needs, including cancellations and rescheduling, please call [place], [phone].

## 2024-07-10 ENCOUNTER — Encounter (HOSPITAL_COMMUNITY): Payer: Self-pay

## 2024-07-10 ENCOUNTER — Ambulatory Visit: Payer: Self-pay | Admitting: Internal Medicine

## 2024-07-10 LAB — BASIC METABOLIC PANEL WITH GFR
BUN/Creatinine Ratio: 16 (ref 9–23)
BUN: 12 mg/dL (ref 6–20)
CO2: 22 mmol/L (ref 20–29)
Calcium: 9.5 mg/dL (ref 8.7–10.2)
Chloride: 101 mmol/L (ref 96–106)
Creatinine, Ser: 0.74 mg/dL (ref 0.57–1.00)
Glucose: 88 mg/dL (ref 70–99)
Potassium: 4 mmol/L (ref 3.5–5.2)
Sodium: 136 mmol/L (ref 134–144)
eGFR: 112 mL/min/1.73 (ref >59)

## 2024-07-11 ENCOUNTER — Other Ambulatory Visit: Payer: Self-pay | Admitting: Obstetrics and Gynecology

## 2024-07-12 DIAGNOSIS — F411 Generalized anxiety disorder: Secondary | ICD-10-CM | POA: Diagnosis not present

## 2024-07-12 DIAGNOSIS — F331 Major depressive disorder, recurrent, moderate: Secondary | ICD-10-CM | POA: Diagnosis not present

## 2024-07-13 ENCOUNTER — Ambulatory Visit (HOSPITAL_COMMUNITY)
Admission: RE | Admit: 2024-07-13 | Discharge: 2024-07-13 | Disposition: A | Discharge status: Home / Self Care | Source: Ambulatory Visit | Attending: Internal Medicine | Admitting: Internal Medicine

## 2024-07-13 DIAGNOSIS — F411 Generalized anxiety disorder: Secondary | ICD-10-CM | POA: Diagnosis not present

## 2024-07-13 DIAGNOSIS — R072 Precordial pain: Secondary | ICD-10-CM | POA: Diagnosis present

## 2024-07-13 DIAGNOSIS — F331 Major depressive disorder, recurrent, moderate: Secondary | ICD-10-CM | POA: Diagnosis not present

## 2024-07-13 DIAGNOSIS — R079 Chest pain, unspecified: Secondary | ICD-10-CM

## 2024-07-13 HISTORY — DX: Chest pain, unspecified: R07.9

## 2024-07-13 MED ORDER — NITROGLYCERIN 0.4 MG SL SUBL
0.8000 mg | SUBLINGUAL_TABLET | Freq: Once | SUBLINGUAL | Status: AC
  Administered 2024-07-13: 0.8 mg via SUBLINGUAL

## 2024-07-13 MED ORDER — IOHEXOL 350 MG/ML SOLN
100.0000 mL | Freq: Once | INTRAVENOUS | Status: AC | PRN
Start: 2024-07-13 — End: 2024-07-13
  Administered 2024-07-13: 100 mL via INTRAVENOUS

## 2024-07-16 DIAGNOSIS — F331 Major depressive disorder, recurrent, moderate: Secondary | ICD-10-CM | POA: Diagnosis not present

## 2024-07-16 DIAGNOSIS — F411 Generalized anxiety disorder: Secondary | ICD-10-CM | POA: Diagnosis not present

## 2024-07-21 DIAGNOSIS — F411 Generalized anxiety disorder: Secondary | ICD-10-CM | POA: Diagnosis not present

## 2024-07-21 DIAGNOSIS — F331 Major depressive disorder, recurrent, moderate: Secondary | ICD-10-CM | POA: Diagnosis not present

## 2024-07-31 ENCOUNTER — Encounter (HOSPITAL_COMMUNITY): Payer: Self-pay | Admitting: Obstetrics and Gynecology

## 2024-07-31 NOTE — Progress Notes (Signed)
 Spoke w/ via phone for pre-op interview--- [facility] Lab needs dos----  UPT, T&S and CBC per anesthesia.       Lab results------Current EKG in Epic dated 07/09/24. COVID test -----patient states asymptomatic no test needed Arrive at -------1130 NPO after MN NO Solid Food.  Clear liquids from MN until---1030 Pre-Surgery Ensure or G2:  Med rec completed Medications to take morning of surgery -----NONE Diabetic medication -----  GLP1 agonist last dose: GLP1 instructions:  Patient instructed no nail polish to be worn day of surgery Patient instructed to bring photo id and insurance card day of surgery Patient aware to have Driver (ride ) / caregiver    for 24 hours after surgery - Mother Madison Rocha Patient Special Instructions ----- Pre-Op special Instructions -----  Patient verbalized understanding of instructions that were given at this phone interview. Patient denies chest pain, sob, fever, cough at the interview.

## 2024-08-02 NOTE — Anesthesia Preprocedure Evaluation (Signed)
 Anesthesia Evaluation  Patient identified by MRN, date of birth, ID band Patient awake    Reviewed: Allergy & Precautions, NPO status , Patient's Chart, lab work & pertinent test results  History of Anesthesia Complications Negative for: history of anesthetic complications  Airway Mallampati: IV  TM Distance: >3 FB Neck ROM: Full  Mouth opening: Limited Mouth Opening Comment: Very small mouth opening Dental  (+) Teeth Intact   Pulmonary neg pulmonary ROS, neg sleep apnea, neg COPD, Patient abstained from smoking.Not current smoker   Pulmonary exam normal breath sounds clear to auscultation       Cardiovascular Exercise Tolerance: Good METS(-) hypertension(-) CAD and (-) Past MI negative cardio ROS (-) dysrhythmias  Rhythm:Regular Rate:Normal - Systolic murmurs    Neuro/Psych  Headaches PSYCHIATRIC DISORDERS  Depression       GI/Hepatic negative GI ROS, Neg liver ROS,neg GERD  ,,  Endo/Other  negative endocrine ROSneg diabetes    Renal/GU negative Renal ROS  negative genitourinary   Musculoskeletal Patient has had jaw surgery as a child, 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   Abdominal   Peds  Hematology   Anesthesia Other Findings Past Medical History: No date: Acute nonintractable headache 07/13/2024: Chest pain     Comment:  Worked-up with cardiology, imagine/EKG negative, per pt               referred to PCP for further testing, no issues since No date: Depression No date: Fatigue No date: Medical history non-contributory  Reproductive/Obstetrics Cervical dysplasia                               Anesthesia Physical Anesthesia Plan  ASA: 1  Anesthesia Plan: MAC   Post-op Pain Management: Tylenol  PO (pre-op)*, Toradol IV (intra-op)* and Ketamine IV*   Induction: Intravenous  PONV Risk Score and Plan: 2 and Propofol infusion, TIVA, Ondansetron , Midazolam  and Dexamethasone  Airway Management Planned: Natural Airway and Nasal Cannula  Additional Equipment: None  Intra-op Plan:   Post-operative Plan:   Informed Consent: I have reviewed the patients History and Physical, chart, labs and discussed the procedure including the risks, benefits and alternatives for the proposed anesthesia with the patient or authorized representative who has indicated his/her understanding and acceptance.     Dental advisory given  Plan Discussed with: CRNA and Surgeon  Anesthesia Plan Comments: (Patient has very limited mouth opening due to prior jaw surgery. She has not had anesthesia since this jaw surgery. I discussed that normally we would do this procedure with an LMA, but I believe it would be almost impossible to fit any appropriately sized LMA through her limited mouth opening. I did discuss that natural airway deep sedation is also an option, and likely a reasonable plan for her. She seems like she would be easy to ventilate. As a backup, I discussed fiberoptic oral vs nasal intubation. Discussed risks of anesthesia with patient, including possibility of difficulty with spontaneous ventilation under anesthesia necessitating airway intervention, PONV, and rare risks such as cardiac or respiratory or neurological events, and allergic reactions. Discussed the role of CRNA in patient's perioperative care. Patient understands.)         Anesthesia Quick Evaluation

## 2024-08-03 ENCOUNTER — Encounter (HOSPITAL_COMMUNITY): Payer: Self-pay | Admitting: Obstetrics and Gynecology

## 2024-08-03 ENCOUNTER — Ambulatory Visit (HOSPITAL_COMMUNITY): Payer: Self-pay | Admitting: Anesthesiology

## 2024-08-03 ENCOUNTER — Other Ambulatory Visit: Payer: Self-pay

## 2024-08-03 ENCOUNTER — Ambulatory Visit (HOSPITAL_COMMUNITY)
Admission: RE | Admit: 2024-08-03 | Discharge: 2024-08-03 | Disposition: A | Discharge status: Home / Self Care | Attending: Obstetrics and Gynecology | Admitting: Obstetrics and Gynecology

## 2024-08-03 ENCOUNTER — Encounter (HOSPITAL_COMMUNITY)
Admission: RE | Disposition: A | Discharge status: Home / Self Care | Payer: Self-pay | Source: Home / Self Care | Attending: Obstetrics and Gynecology

## 2024-08-03 DIAGNOSIS — N871 Moderate cervical dysplasia: Secondary | ICD-10-CM

## 2024-08-03 DIAGNOSIS — R519 Headache, unspecified: Secondary | ICD-10-CM | POA: Diagnosis not present

## 2024-08-03 DIAGNOSIS — F32A Depression, unspecified: Secondary | ICD-10-CM | POA: Insufficient documentation

## 2024-08-03 DIAGNOSIS — Z79899 Other long term (current) drug therapy: Secondary | ICD-10-CM | POA: Diagnosis not present

## 2024-08-03 DIAGNOSIS — Z01818 Encounter for other preprocedural examination: Secondary | ICD-10-CM

## 2024-08-03 HISTORY — PX: LEEP: SHX91

## 2024-08-03 HISTORY — PX: COLPOSCOPY: SHX161

## 2024-08-03 LAB — CBC
HCT: 40.5 % (ref 36.0–46.0)
Hemoglobin: 13.1 g/dL (ref 12.0–15.0)
MCH: 29.6 pg (ref 26.0–34.0)
MCHC: 32.3 g/dL (ref 30.0–36.0)
MCV: 91.6 fL (ref 80.0–100.0)
Platelets: 266 K/uL (ref 150–400)
RBC: 4.42 MIL/uL (ref 3.87–5.11)
RDW: 13.9 % (ref 11.5–15.5)
WBC: 6.1 K/uL (ref 4.0–10.5)
nRBC: 0 % (ref 0.0–0.2)

## 2024-08-03 LAB — POCT PREGNANCY, URINE: Preg Test, Ur: NEGATIVE

## 2024-08-03 LAB — TYPE AND SCREEN
ABO/RH(D): AB POS
Antibody Screen: NEGATIVE

## 2024-08-03 SURGERY — LEEP (LOOP ELECTROSURGICAL EXCISION PROCEDURE)
Anesthesia: Monitor Anesthesia Care | Site: Cervix

## 2024-08-03 MED ORDER — DEXAMETHASONE SOD PHOSPHATE PF 10 MG/ML IJ SOLN
INTRAMUSCULAR | Status: DC | PRN
  Administered 2024-08-03: 8 mg via INTRAVENOUS

## 2024-08-03 MED ORDER — OXYCODONE HCL 5 MG/5ML PO SOLN: 5.0000 mg | Freq: Once | ORAL | Status: DC | PRN

## 2024-08-03 MED ORDER — MONSELS FERRIC SUBSULFATE EX SOLN
CUTANEOUS | Status: DC | PRN
  Administered 2024-08-03: 1 via TOPICAL

## 2024-08-03 MED ORDER — GLYCOPYRROLATE PF 0.2 MG/ML IJ SOSY
PREFILLED_SYRINGE | INTRAMUSCULAR | Status: DC | PRN
  Administered 2024-08-03: .2 mg via INTRAVENOUS

## 2024-08-03 MED ORDER — MIDAZOLAM HCL 2 MG/2ML IJ SOLN
INTRAMUSCULAR | Status: AC
Start: 2024-08-03 — End: 2024-08-03
  Filled 2024-08-03: qty 2

## 2024-08-03 MED ORDER — MONSELS FERRIC SUBSULFATE EX SOLN
CUTANEOUS | Status: AC
Start: 2024-08-03 — End: 2024-08-03
  Filled 2024-08-03: qty 8

## 2024-08-03 MED ORDER — LIDOCAINE-EPINEPHRINE 1 %-1:100000 IJ SOLN
INTRAMUSCULAR | Status: AC
Start: 2024-08-03 — End: 2024-08-03
  Filled 2024-08-03: qty 1

## 2024-08-03 MED ORDER — KETOROLAC TROMETHAMINE 30 MG/ML IJ SOLN
INTRAMUSCULAR | Status: AC
Start: 2024-08-03 — End: 2024-08-03
  Filled 2024-08-03: qty 1

## 2024-08-03 MED ORDER — LACTATED RINGERS IV SOLN
INTRAVENOUS | Status: DC
Start: 2024-08-03 — End: 2024-08-03

## 2024-08-03 MED ORDER — ONDANSETRON HCL 4 MG/2ML IJ SOLN
INTRAMUSCULAR | Status: AC
  Filled 2024-08-03: qty 2

## 2024-08-03 MED ORDER — ACETIC ACID 4% SOLUTION
Status: DC | PRN
  Administered 2024-08-03: 1 via TOPICAL

## 2024-08-03 MED ORDER — SCOPOLAMINE 1 MG/3DAYS TD PT72
MEDICATED_PATCH | TRANSDERMAL | Status: DC
Start: 2024-08-03 — End: 2024-08-03
  Filled 2024-08-03: qty 1

## 2024-08-03 MED ORDER — IODINE STRONG (LUGOLS) 5 % PO SOLN
ORAL | Status: AC
  Filled 2024-08-03: qty 1

## 2024-08-03 MED ORDER — HYDROMORPHONE HCL 1 MG/ML IJ SOLN
INTRAMUSCULAR | Status: AC
  Filled 2024-08-03: qty 1

## 2024-08-03 MED ORDER — PROPOFOL 10 MG/ML IV BOLUS
INTRAVENOUS | Status: DC | PRN
  Administered 2024-08-03: 100 ug/kg/min via INTRAVENOUS
  Administered 2024-08-03: 50 mg via INTRAVENOUS
  Administered 2024-08-03: 20 mg via INTRAVENOUS
  Administered 2024-08-03: 50 mg via INTRAVENOUS

## 2024-08-03 MED ORDER — ACETAMINOPHEN 500 MG PO TABS
ORAL_TABLET | ORAL | Status: AC
  Filled 2024-08-03: qty 2

## 2024-08-03 MED ORDER — ORAL CARE MOUTH RINSE: 15.0000 mL | Freq: Once | OROMUCOSAL | Status: AC

## 2024-08-03 MED ORDER — LIDOCAINE HCL (PF) 1 % IJ SOLN
INTRAMUSCULAR | Status: AC
  Filled 2024-08-03: qty 30

## 2024-08-03 MED ORDER — MIDAZOLAM HCL 2 MG/2ML IJ SOLN
INTRAMUSCULAR | Status: DC | PRN
  Administered 2024-08-03: 2 mg via INTRAVENOUS

## 2024-08-03 MED ORDER — KETAMINE HCL 50 MG/5ML IJ SOSY
PREFILLED_SYRINGE | INTRAMUSCULAR | Status: DC | PRN
  Administered 2024-08-03: 10 mg via INTRAVENOUS
  Administered 2024-08-03: 20 mg via INTRAVENOUS

## 2024-08-03 MED ORDER — GLYCOPYRROLATE PF 0.2 MG/ML IJ SOSY
PREFILLED_SYRINGE | INTRAMUSCULAR | Status: AC
  Filled 2024-08-03: qty 1

## 2024-08-03 MED ORDER — SCOPOLAMINE 1 MG/3DAYS TD PT72
1.0000 | MEDICATED_PATCH | TRANSDERMAL | Status: DC
  Administered 2024-08-03: 1 mg via TRANSDERMAL

## 2024-08-03 MED ORDER — AMISULPRIDE (ANTIEMETIC) 5 MG/2ML IV SOLN: 10.0000 mg | Freq: Once | INTRAVENOUS | Status: DC | PRN

## 2024-08-03 MED ORDER — CHLORHEXIDINE GLUCONATE 0.12 % MT SOLN
OROMUCOSAL | Status: AC
  Filled 2024-08-03: qty 15

## 2024-08-03 MED ORDER — KETAMINE HCL 50 MG/5ML IJ SOSY
PREFILLED_SYRINGE | INTRAMUSCULAR | Status: AC
  Filled 2024-08-03: qty 5

## 2024-08-03 MED ORDER — KETOROLAC TROMETHAMINE 30 MG/ML IJ SOLN: 30.0000 mg | Freq: Once | INTRAMUSCULAR | Status: DC | PRN

## 2024-08-03 MED ORDER — VASOPRESSIN 20 UNIT/ML IV SOLN
INTRAVENOUS | Status: AC
  Filled 2024-08-03: qty 1

## 2024-08-03 MED ORDER — HYDROMORPHONE HCL 1 MG/ML IJ SOLN
0.2500 mg | INTRAMUSCULAR | Status: DC | PRN
  Administered 2024-08-03 (×2): 0.5 mg via INTRAVENOUS

## 2024-08-03 MED ORDER — ONDANSETRON HCL 4 MG/2ML IJ SOLN
INTRAMUSCULAR | Status: DC | PRN
  Administered 2024-08-03: 4 mg via INTRAVENOUS

## 2024-08-03 MED ORDER — LIDOCAINE 2% (20 MG/ML) 5 ML SYRINGE
INTRAMUSCULAR | Status: DC | PRN
  Administered 2024-08-03: 100 mg via INTRAVENOUS

## 2024-08-03 MED ORDER — LIDOCAINE HCL 1 % IJ SOLN
INTRAMUSCULAR | Status: DC | PRN
  Administered 2024-08-03: 10 mL

## 2024-08-03 MED ORDER — OXYCODONE HCL 5 MG PO TABS: 5.0000 mg | ORAL_TABLET | Freq: Four times a day (QID) | ORAL | 0 refills | Status: DC | PRN

## 2024-08-03 MED ORDER — ONDANSETRON HCL 4 MG/2ML IJ SOLN: 4.0000 mg | Freq: Once | INTRAMUSCULAR | Status: DC | PRN

## 2024-08-03 MED ORDER — OXYCODONE HCL 5 MG PO TABS: 5.0000 mg | ORAL_TABLET | Freq: Once | ORAL | Status: DC | PRN

## 2024-08-03 MED ORDER — ACETAMINOPHEN 500 MG PO TABS
1000.0000 mg | ORAL_TABLET | Freq: Once | ORAL | Status: AC
  Administered 2024-08-03: 1000 mg via ORAL

## 2024-08-03 MED ORDER — ACETIC ACID 5 % SOLN
Status: AC
  Filled 2024-08-03: qty 12

## 2024-08-03 MED ORDER — KETOROLAC TROMETHAMINE 30 MG/ML IJ SOLN
INTRAMUSCULAR | Status: DC | PRN
Start: 2024-08-03 — End: 2024-08-03
  Administered 2024-08-03: 30 mg via INTRAVENOUS

## 2024-08-03 MED ORDER — IBUPROFEN 600 MG PO TABS: 600.0000 mg | ORAL_TABLET | Freq: Four times a day (QID) | ORAL | 1 refills | Status: DC | PRN

## 2024-08-03 MED ORDER — CHLORHEXIDINE GLUCONATE 0.12 % MT SOLN
15.0000 mL | Freq: Once | OROMUCOSAL | Status: AC
  Administered 2024-08-03: 15 mL via OROMUCOSAL

## 2024-08-03 MED ORDER — SODIUM CHLORIDE (PF) 0.9 % IJ SOLN
INTRAMUSCULAR | Status: AC
Start: 2024-08-03 — End: 2024-08-03
  Filled 2024-08-03: qty 100

## 2024-08-03 MED ORDER — MEPERIDINE HCL 25 MG/ML IJ SOLN: 6.2500 mg | INTRAMUSCULAR | Status: DC | PRN

## 2024-08-03 SURGICAL SUPPLY — 24 items
APPLICATOR COTTON TIP 6 STRL (MISCELLANEOUS) ×1 IMPLANT
CATH ROBINSON RED A/P 16FR (CATHETERS) ×1 IMPLANT
CLEANER TIP ELECTROSURG 2X2 (MISCELLANEOUS) ×1 IMPLANT
COVER MAYO STAND STRL (DRAPES) ×1 IMPLANT
ELECT BALL LEEP 5MM RED (ELECTRODE) IMPLANT
ELECTRODE LOOP LP RND 15X12GRN (CUTTING LOOP) IMPLANT
ELECTRODE LOOP LP RND 20X12WHT (CUTTING LOOP) IMPLANT
ELECTRODE LOOP LP SQR 10X10ORG (CUTTING LOOP) IMPLANT
ELECTRODE REM PT RTRN 9FT ADLT (ELECTROSURGICAL) ×1 IMPLANT
EXTENDER ELECT LOOP LEEP 10CM (CUTTING LOOP) IMPLANT
GLOVE BIO SURGEON STRL SZ7.5 (GLOVE) ×1 IMPLANT
GLOVE BIOGEL PI IND STRL 7.0 (GLOVE) ×1 IMPLANT
GLOVE BIOGEL PI IND STRL 7.5 (GLOVE) ×1 IMPLANT
GOWN STRL REUS W/ TWL LRG LVL3 (GOWN DISPOSABLE) ×2 IMPLANT
KIT TURNOVER KIT B (KITS) ×1 IMPLANT
PACK VAGINAL MINOR WOMEN LF (CUSTOM PROCEDURE TRAY) ×1 IMPLANT
PAD OB MATERNITY 11 LF (PERSONAL CARE ITEMS) ×1 IMPLANT
PENCIL SMOKE EVACUATOR (MISCELLANEOUS) ×1 IMPLANT
SCOPETTES 8 STERILE (MISCELLANEOUS) ×2 IMPLANT
SOLN 0.9% NACL 1000 ML (IV SOLUTION) ×1 IMPLANT
SOLN 0.9% NACL POUR BTL 1000ML (IV SOLUTION) ×1 IMPLANT
SPONGE SURGIFOAM ABS GEL 12-7 (HEMOSTASIS) IMPLANT
SUT VIC AB 0 CT1 27XBRD ANBCTR (SUTURE) ×1 IMPLANT
TOWEL GREEN STERILE FF (TOWEL DISPOSABLE) ×2 IMPLANT

## 2024-08-03 NOTE — H&P (Addendum)
 Madison Rocha is an 28 y.o. female. 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  Pertinent Gynecological History: h/o regular cycles, denies abnormal paps prior to 2024  Contraception: abstinence  Last pap: abnormal: LGSIL Date: 12/28/2023 OB History: G3, P3003   Menstrual History:  Patient's last menstrual period was 07/19/2024 (exact date).    Past Medical History:  Diagnosis Date   Acute nonintractable headache    Chest pain 07/13/2024   Worked-up with cardiology, imagine/EKG negative, per pt referred to PCP for further testing, no issues since   Depression    Fatigue    Medical history non-contributory     Past Surgical History:  Procedure Laterality Date   MANDIBLE SURGERY  2011    Family History  Problem Relation Age of Onset   Hypertension Mother     Social History:  reports that she has never smoked. She has never used smokeless tobacco. She reports that she does not drink alcohol and does not use drugs.  Allergies: No Known Allergies  Medications Prior to Admission  Medication Sig Dispense Refill Last Dose/Taking   metoprolol  tartrate (LOPRESSOR ) 50 MG tablet Take 1 tablet (50 mg total) by mouth once for 1 dose. Take 90-120 minutes prior to scan. Hold for SBP less than 110. (Patient not taking: Reported on 07/31/2024) 1 tablet 0 Not Taking    Review of Systems Denies F/C/N/V/D  Blood pressure 136/86, pulse 61, temperature 98 F (36.7 C), temperature source Oral, resp. rate 16, height 5' 9 (1.753 m), weight 72.6 kg, last menstrual period 07/19/2024, SpO2 100%. Physical Exam Lungs unlabored breathing CV RRR Abdomen soft, NT Extremities no calf tenderness  Results for orders placed or performed during the hospital encounter of 08/03/24 (from the past 24 hours)  CBC     Status: None   Collection Time: 08/03/24 12:14 PM  Result Value Ref Range   WBC 6.1 4.0 - 10.5 K/uL   RBC 4.42 3.87 - 5.11 MIL/uL   Hemoglobin 13.1 12.0 - 15.0 g/dL    HCT 59.4 63.9 - 53.9 %   MCV 91.6 80.0 - 100.0 fL   MCH 29.6 26.0 - 34.0 pg   MCHC 32.3 30.0 - 36.0 g/dL   RDW 86.0 88.4 - 84.4 %   Platelets 266 150 - 400 K/uL   nRBC 0.0 0.0 - 0.2 %  Pregnancy, urine POC     Status: None   Collection Time: 08/03/24 12:25 PM  Result Value Ref Range   Preg Test, Ur NEGATIVE NEGATIVE  Type and screen  [facility]     Status: None (Preliminary result)   Collection Time: 08/03/24  1:02 PM  Result Value Ref Range   ABO/RH(D) PENDING    Antibody Screen PENDING    Sample Expiration      08/06/2024,2359 Performed at [facility], [address] [place], [place] [address]     No results found.  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  Risks benefits and alternatives reviewed including but not limited to bleeding infection and injury.  Questions answered and consent signed and witnessed.  Jon CINDERELLA Rummer 08/03/2024, 2:00 PM

## 2024-08-03 NOTE — Transfer of Care (Signed)
 Immediate Anesthesia Transfer of Care Note  Patient: Madison Rocha  Procedure(s) Performed: LEEP (LOOP ELECTROSURGICAL EXCISION PROCEDURE) (Cervix) COLPOSCOPY (Cervix)  Patient Location: PACU  Anesthesia Type:MAC  Level of Consciousness: awake and alert   Airway & Oxygen Therapy: Patient Spontanous Breathing and Patient connected to face mask oxygen  Post-op Assessment: Report given to RN and Post -op Vital signs reviewed and stable  Post vital signs: Reviewed and stable  Last Vitals:  Vitals Value Taken Time  BP 126/74 08/03/24 15:20  Temp    Pulse 104 08/03/24 15:22  Resp 12 08/03/24 15:22  SpO2 100 % 08/03/24 15:22  Vitals shown include unfiled device data.  Last Pain:  Vitals:   08/03/24 1239  TempSrc: Oral  PainSc: 0-No pain      Patients Stated Pain Goal: 5 (08/03/24 1239)  Complications: No notable events documented.

## 2024-08-03 NOTE — OR Nursing (Signed)
 Patient in phase 2 for extended period due to waiting for her ride home. Andree Kerns rn

## 2024-08-03 NOTE — Discharge Instructions (Signed)

## 2024-08-03 NOTE — Discharge Instr - Supplementary Instructions (Signed)
 May take Tylenol  after 6:50pm if needed for discomfort.

## 2024-08-03 NOTE — Anesthesia Procedure Notes (Addendum)
 Procedure Name: MAC Date/Time: 08/03/2024 2:20 PM  Performed by: Nada Corean CROME, CRNAPre-anesthesia Checklist: Patient identified, Suction available, Emergency Drugs available, Patient being monitored and Timeout performed Patient Re-evaluated:Patient Re-evaluated prior to induction Oxygen Delivery Method: Simple face mask Preoxygenation: Pre-oxygenation with 100% oxygen Induction Type: IV induction Placement Confirmation: positive ETCO2 and breath sounds checked- equal and bilateral Dental Injury: Teeth and Oropharynx as per pre-operative assessment  Comments: Pt with previous jaw surgery and very limited mouth opening. Plan for a MAC and we have Fiberoptic/flexible bronchoscope at bedside on standby.

## 2024-08-06 ENCOUNTER — Encounter (HOSPITAL_COMMUNITY): Payer: Self-pay | Admitting: Obstetrics and Gynecology

## 2024-08-06 NOTE — Anesthesia Postprocedure Evaluation (Signed)
 Anesthesia Post Note  Patient: Ardelia Plush-Williams  Procedure(s) Performed: LEEP (LOOP ELECTROSURGICAL EXCISION PROCEDURE) (Cervix) COLPOSCOPY (Cervix)     Patient location during evaluation: PACU Anesthesia Type: MAC Level of consciousness: awake and alert Pain management: pain level controlled Vital Signs Assessment: post-procedure vital signs reviewed and stable Respiratory status: spontaneous breathing, nonlabored ventilation and respiratory function stable Cardiovascular status: stable and blood pressure returned to baseline Anesthetic complications: no   No notable events documented.  Last Vitals:  Vitals:   08/03/24 1616 08/03/24 1620  BP: 121/86   Pulse: (!) 58 (!) 58  Resp: 17 11  Temp: 36.9 C   SpO2: 100% 99%    Last Pain:  Vitals:   08/03/24 1553  TempSrc:   PainSc: 4                  Debby FORBES Like

## 2024-08-07 LAB — SURGICAL PATHOLOGY

## 2024-08-08 ENCOUNTER — Ambulatory Visit: Admitting: Physician Assistant

## 2024-08-08 ENCOUNTER — Encounter: Payer: Self-pay | Admitting: Physician Assistant

## 2024-08-08 VITALS — BP 120/79 | HR 67 | Wt 155.4 lb

## 2024-08-08 DIAGNOSIS — Z111 Encounter for screening for respiratory tuberculosis: Secondary | ICD-10-CM

## 2024-08-08 DIAGNOSIS — Z021 Encounter for pre-employment examination: Secondary | ICD-10-CM | POA: Diagnosis not present

## 2024-08-08 NOTE — Progress Notes (Unsigned)
 Established Patient Office Visit  Subjective   Patient ID: Madison Rocha, female    DOB: 1996-02-09  Age: 28 y.o. MRN: [id number]  Chief Complaint  Patient presents with   Annual Exam  Discussed the use of AI scribe software for clinical note transcription with the patient, who gave verbal consent to proceed.  History of Present Illness   Madison Rocha is a 28 year old female who presents for a work-related exam and TB skin test.  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 She is not taking any medications and has no current symptoms or concerns. Previous symptoms related to iron  deficiency have resolved.   Past Medical History:  Diagnosis Date   Acute nonintractable headache    Chest pain 07/13/2024   Worked-up with cardiology, imagine/EKG negative, per pt referred to PCP for further testing, no issues since   Depression    Fatigue    Medical history non-contributory    Social History   Socioeconomic History   Marital status: Single    Spouse name: Not on file   Number of children: Not on file   Years of education: Not on file   Highest education level: Bachelor's degree (e.g., BA, AB, BS)  Occupational History   Not on file  Tobacco Use   Smoking status: Never   Smokeless tobacco: Never  Vaping Use   Vaping status: Never Used  Substance and Sexual Activity   Alcohol use: Never   Drug use: Never   Sexual activity: Yes    Birth control/protection: None  Other Topics Concern   Not on file  Social History Narrative   Not on file   Social Drivers of Health   Financial Resource Strain: High Risk (03/15/2024)   Overall Financial Resource Strain (CARDIA)    Difficulty of Paying Living Expenses: Very hard  Food Insecurity: Low Risk  (03/27/2024)   Received from [facility]   Hunger Vital Sign    Within the past 12 months, you worried that your food would run out before you got money to buy more:  Not on file    Within the past 12 months, the food you bought just didn't last and you didn't have money to get more. : Never true  Transportation Needs: No Transportation Needs (03/27/2024)   Received from [facility]    In the past 12 months, has lack of reliable transportation kept you from medical appointments, meetings, work or from getting things needed for daily living? : No  Recent Concern: Transportation Needs - Unmet Transportation Needs (03/15/2024)   PRAPARE - Transportation    Lack of Transportation (Medical): Yes    Lack of Transportation (Non-Medical): Yes  Physical Activity: Insufficiently Active (03/15/2024)   Exercise Vital Sign    Days of Exercise per Week: 4 days    Minutes of Exercise per Session: 30 min  Stress: Stress Concern Present (03/15/2024)   [employer] of Occupational Health - Occupational Stress Questionnaire    Feeling of Stress : Very much  Social Connections: Socially Isolated (03/15/2024)   Social Connection and Isolation Panel    Frequency of Communication with Friends and Family: Once a week    Frequency of Social Gatherings with Friends and Family: Never    Attends Religious Services: 1 to 4 times per year    Active Member of [employer] or Organizations: No    Attends Banker Meetings: Not on  file    Marital Status: Never married  Catering manager Violence: Not on file   Family History  Problem Relation Age of Onset   Hypertension Mother    No Known Allergies  Review of Systems  Constitutional: Negative.   HENT: Negative.    Eyes: Negative.   Respiratory:  Negative for shortness of breath.   Cardiovascular:  Negative for chest pain.  Gastrointestinal: Negative.   Genitourinary: Negative.   Musculoskeletal:  Negative for back pain and joint pain.  Skin: Negative.   Neurological: Negative.   Endo/Heme/Allergies: Negative.   Psychiatric/Behavioral: Negative.        Objective:     BP 120/79 (BP Location:  Left Arm, Patient Position: Sitting, Cuff Size: Normal)   Pulse 67   Wt 155 lb 6.4 oz (70.5 kg)   LMP 07/19/2024 (Exact Date)   SpO2 100%   BMI 22.95 kg/m     Physical Exam Vitals and nursing note reviewed.    GENERAL: Alert, cooperative, well developed, no acute distress. HEENT: Normocephalic, normal oropharynx, moist mucous membranes. NECK: Supple, no masses. CHEST: Clear to auscultation bilaterally, no wheezes, rhonchi, or crackles. CARDIOVASCULAR: Normal heart rate and rhythm, S1 and S2 normal without murmurs. EXTREMITIES: No cyanosis or edema. NEUROLOGICAL: Cranial nerves grossly intact, moves all extremities without gross motor or sensory deficit.    Assessment & Plan:   Problem List Items Addressed This Visit   None 1. Physical exam, pre-employment (Primary) Paperwork completed on patient's behalf  2. Visit for TB skin test  - PPD   I have reviewed the patient's medical history (PMH, PSH, Social History, Family History, Medications, and allergies) , and have been updated if relevant. I spent 20 minutes reviewing chart and  face to face time with patient.     No follow-ups on file.    Madison RAMAN Mayers, PA-C

## 2024-08-08 NOTE — Patient Instructions (Signed)
 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 You have no current symptoms or concerns, and your previous symptoms related to iron  deficiency have resolved.  YOUR PLAN:  -ROUTINE ADULT WELLNESS VISIT: This visit was a routine check-up to ensure your overall health. Your blood pressure, heart, and lung examinations were normal, and you have no back or knee pain. Your previous iron  deficiency symptoms have resolved. We completed the necessary paperwork for your work-related exam.  -TB SKIN TEST: A TB skin test is used to check for a tuberculosis infection. You need to return to the community health center to have the test read.  INSTRUCTIONS:  Please return to the community health center to have your TB skin test read. You will receive your after-visit summary upon your return or via MyChart.

## 2024-08-10 ENCOUNTER — Ambulatory Visit: Attending: Physician Assistant

## 2024-08-10 LAB — TB SKIN TEST
Induration: 0 mm
TB Skin Test: NEGATIVE

## 2024-08-12 NOTE — Op Note (Signed)
 Preop Diagnosis: Moderate cervical dysplasia   Postop Diagnosis: Moderate cervical dysplasia   Procedure: LEEP PR COLPOSCOPY CERVIX VAG LOOP ELTRD BX CERVIX [[id number]] PR COLPOSCOPY CERVIX VAG LOOP ELTRD BX CERVIX [[id number]]   Anesthesia: Monitor Anesthesia Care   Anesthesiologist: See Flowsheet   Attending: Jon Rummer, MD   Assistant: N/a  Findings: AW Lesions  Pathology: LEEP specimen  Fluids: See Flowsheet  UOP: See Flowsheet  EBL: See Flowsheet  Complications: None  Procedure: The patient was taken to the operating room after the risks, benefits and alternatives were discussed with the patient. The patient verbalized understanding and consent signed and witnessed. The patient was placed under general anesthesia per anesthesiologist and prepped and draped in the normal sterile fashion.  A TIME OUT was performed per protocol.  A weighted speculum was placed in the patient's vagina and the anterior lip of the cervix was grasped with a single tooth tenaculum.  LEEP was performed without difficulty and the bed of the cervix was cauterized with the ball tip cautery.  The bed of the cervix continued to bleed.  Dilute pitressin was injected and stitches placed at 3 and 9 O'clock, monsels solution and gelfoam applied.  Hemostasis was achieved.  A stitch was placed at 12:00 and specimen sent to pathology.  All instruments were removed. Sponge lap and needle count was correct. Patient tolerated the procedure well and was awaiting transfer to the recovery room in good condition.

## 2024-08-17 ENCOUNTER — Ambulatory Visit (HOSPITAL_COMMUNITY)
Admission: RE | Admit: 2024-08-17 | Discharge: 2024-08-17 | Disposition: A | Discharge status: Home / Self Care | Source: Ambulatory Visit | Attending: Internal Medicine | Admitting: Internal Medicine

## 2024-08-17 DIAGNOSIS — R072 Precordial pain: Secondary | ICD-10-CM | POA: Insufficient documentation

## 2024-08-17 LAB — ECHOCARDIOGRAM COMPLETE
Area-P 1/2: 3.74 cm2
S' Lateral: 2.97 cm

## 2024-08-20 DIAGNOSIS — F331 Major depressive disorder, recurrent, moderate: Secondary | ICD-10-CM | POA: Diagnosis not present

## 2024-08-23 DIAGNOSIS — F331 Major depressive disorder, recurrent, moderate: Secondary | ICD-10-CM | POA: Diagnosis not present

## 2024-08-24 DIAGNOSIS — Z01419 Encounter for gynecological examination (general) (routine) without abnormal findings: Secondary | ICD-10-CM | POA: Diagnosis not present

## 2024-08-24 DIAGNOSIS — N760 Acute vaginitis: Secondary | ICD-10-CM | POA: Diagnosis not present

## 2024-08-24 DIAGNOSIS — N898 Other specified noninflammatory disorders of vagina: Secondary | ICD-10-CM | POA: Diagnosis not present

## 2024-08-24 DIAGNOSIS — N921 Excessive and frequent menstruation with irregular cycle: Secondary | ICD-10-CM | POA: Diagnosis not present

## 2024-08-24 DIAGNOSIS — B9689 Other specified bacterial agents as the cause of diseases classified elsewhere: Secondary | ICD-10-CM | POA: Diagnosis not present

## 2024-08-28 ENCOUNTER — Ambulatory Visit: Admitting: Family

## 2024-08-28 ENCOUNTER — Encounter: Payer: Self-pay | Admitting: Family

## 2024-08-28 VITALS — BP 112/70 | HR 76 | Temp 98.5°F | Ht 69.0 in | Wt 155.2 lb

## 2024-08-28 DIAGNOSIS — Z0000 Encounter for general adult medical examination without abnormal findings: Secondary | ICD-10-CM | POA: Diagnosis not present

## 2024-08-28 DIAGNOSIS — F331 Major depressive disorder, recurrent, moderate: Secondary | ICD-10-CM | POA: Diagnosis not present

## 2024-08-28 DIAGNOSIS — M791 Myalgia, unspecified site: Secondary | ICD-10-CM | POA: Diagnosis not present

## 2024-08-28 NOTE — Progress Notes (Signed)
 New Patient Office Visit  Subjective:  Patient ID: Madison Rocha, female    DOB: 04-23-1996  Age: 28 y.o. MRN: [id number]  CC:  Chief Complaint  Patient presents with   New Patient (Initial Visit)   Annual Exam   HPI Madison Rocha presents for establishing care today. Discussed the use of AI scribe software for clinical note transcription with the patient, who gave verbal consent to proceed.  History of Present Illness Madison Rocha is a 28 year old female who presents with chest pain and headaches.  She experiences intermittent chest pain, which persists despite evaluation by a cardiologist. She denies asthma but has difficulty breathing, especially when walking up stairs. She denies any hx of asthma. Headaches can last up to a week and sometimes require ibuprofen  or Tylenol . They occur independently of chest pain and are associated with nasal and throat symptoms. Headaches may worsen around her menstrual cycle. She experiences leg pain and cramping, which she attributes to frequent walking. The pain sometimes worsens at night. She drinks two to two and a half liters of water daily and recently started working out but paused due to back pain from a workout injury. She has hard stools despite adequate water intake and use of stool softeners. She avoids magnesium  citrate due to ingestion difficulty. Her sleep is disrupted, often waking before her alarm and struggling to return to sleep. She aims for a 9 PM bedtime but sometimes stays up until midnight, waking around 4 AM.  Assessment & Plan Myalgia and lower extremity cramps Intermittent leg pain and cramps possibly linked to physical activity and hydration. Adequate hydration reported, may need magnesium  for muscle recovery. - Recommended magnesium  supplementation, 300-400 mg twice daily. - Advised on magnesium  capsules to avoid gastrointestinal side effects.  Headache Intermittent headaches with possible  triggers including allergies, stress, and hormonal changes. Current management with ibuprofen  or Tylenol  is not always effective. - Consider Excedrin Migraine or generic equivalent to use as needed. - Advised on lifestyle modifications: regular hydration, consistent meals, and sleep schedule. - Discussed potential use of Aleve (naproxen) for menstrual-related headache prevention, 1 pill daily starting a few days prior & during.  Hemorrhoids and constipation Chronic constipation with history of hemorrhoids. She has increased her dietary fiber intake with foods like dates, cabbage, and green vegetables. Current management includes stool softeners and increased water intake. - Recommended increasing fiber through over-the-counter fiber supplements such as Benefiber or Citrucel. - Increase daily cardio exercise shooting for 30 minutes. - Continue current use of stool softeners as needed.  Sleep disturbance Difficulty maintaining sleep, often waking early. Possible factors include stress, lifestyle habits, and lack of exercise. - Encouraged regular physical activity, such as morning exercise. - Advised on sleep hygiene: avoid caffeine & drinking fluids, and screen time before bedtime. - Try white noise during night via phone or use of a fan. - Discussed potential benefits of magnesium  supplementation for sleep maintenance and provided handout.   Subjective:    Outpatient Medications Prior to Visit  Medication Sig Dispense Refill   ibuprofen  (ADVIL ) 600 MG tablet Take 1 tablet (600 mg total) by mouth every 6 (six) hours as needed. 40 tablet 1   metoprolol  tartrate (LOPRESSOR ) 50 MG tablet Take 1 tablet (50 mg total) by mouth once for 1 dose. Take 90-120 minutes prior to scan. Hold for SBP less than 110. (Patient not taking: Reported on 07/31/2024) 1 tablet 0   oxyCODONE  (ROXICODONE ) 5 MG immediate release tablet Take 1 tablet (5  mg total) by mouth every 6 (six) hours as needed for severe pain (pain  score 7-10), moderate pain (pain score 4-6) or breakthrough pain. 6 tablet 0   No facility-administered medications prior to visit.   Past Medical History:  Diagnosis Date   Acute nonintractable headache    Arrhythmia    Bilateral impacted cerumen 12/21/2022   Chest pain 07/13/2024   Worked-up with cardiology, imagine/EKG negative, per pt referred to PCP for further testing, no issues since   Conductive hearing loss, bilateral 12/21/2022   Depression    Fatigue    Heart murmur    Maternal anemia, with delivery - IDA with superimposed ABL anemia 06/20/2020   Medical history non-contributory    Perineal laceration with delivery, first degree 06/20/2020   Post-dates pregnancy 05/26/2022   Postpartum care following vaginal delivery 8/26 06/20/2020   SVD (spontaneous vaginal delivery) 06/19/2020   Past Surgical History:  Procedure Laterality Date   COLPOSCOPY N/A 08/03/2024   Procedure: COLPOSCOPY;  Surgeon: Henry Slough, MD;  [facility];  Service: Gynecology;  Laterality: N/A;   LEEP N/A 08/03/2024   Procedure: LEEP (LOOP ELECTROSURGICAL EXCISION PROCEDURE);  Surgeon: Henry Slough, MD;  Location: [facility] OR;  Service: Gynecology;  Laterality: N/A;   MANDIBLE SURGERY  2011   Objective:   Today's Vitals: BP 112/70 (BP Location: Left Arm, Patient Position: Sitting, Cuff Size: Large)   Pulse 76   Temp 98.5 F (36.9 C) (Temporal)   Ht 5' 9 (1.753 m)   Wt 155 lb 4 oz (70.4 kg)   LMP 08/11/2024 (Exact Date)   SpO2 99%   BMI 22.93 kg/m   Physical Exam Vitals and nursing note reviewed.  Constitutional:      Appearance: Normal appearance.  Cardiovascular:     Rate and Rhythm: Normal rate and regular rhythm.  Pulmonary:     Effort: Pulmonary effort is normal.     Breath sounds: Normal breath sounds.  Musculoskeletal:        General: Normal range of motion.  Skin:    General: Skin is warm and dry.  Neurological:     Mental Status: She is alert.  Psychiatric:         Mood and Affect: Mood normal.        Behavior: Behavior normal.    No orders of the defined types were placed in this encounter.  [name], NP

## 2024-08-28 NOTE — Patient Instructions (Addendum)
 Welcome to [facility], It was a pleasure meeting you today!  For your headaches I recommend trying over the counter Excedrin migraine (look for generic) as needed.  Also recommend taking generic Aleve, 1 pill daily a few days prior to your menstrual cycle and during your cycle to avoid menstrual headaches/migraines.  For your muscle cramps and bowels, try Magnesium  glycinate, L-threonate, or taurate which also can help with anxiety, maintaining sleep (if taken at night), muscle recovery, hot flashes, and maintaining blood pressure. Look for chelated form (better absorbability) and for any over the counter supplement or vitamin, look for organic or has a 3rd party seal from NSF international, [employer] or USP. This authenticates the quality but not the efficacy (since not FDA approved).    Please schedule a physical with fasting labs today.   PLEASE NOTE: If you had any LAB tests please let us  know if you have not heard back within a few days. You may see your results on MyChart before we have a chance to review them but we will give you a call once they are reviewed by us . If we ordered any REFERRALS today, please let us  know if you have not heard from their office within the next week.  Let us  know through MyChart if you are needing REFILLS, or have your pharmacy send us  the request. You can also use MyChart to communicate with me or any office staff.  Please try these tips to maintain a healthy lifestyle: It is important that you exercise regularly at least 30 minutes 5 times a week. Think about what you will eat, plan ahead. Choose whole foods, & think  clean, green, fresh or frozen over canned, processed or packaged foods which are more sugary, salty, and fatty. 70 to 75% of food eaten should be fresh vegetables and protein. 2-3  meals daily with healthy snacks between meals, but must be whole fruit, protein or vegetables. Aim to eat over a 10 hour period  when you are active, for example, 7am to 5pm, and then STOP after your last meal of the day, drinking only water.  Shorter eating windows, 6-8 hours, are showing benefits in heart disease and blood sugar regulation. Drink water every day! Shoot for 64 ounces daily = 8 cups, no other drink is as healthy! Fruit juice is best enjoyed in a healthy way, by EATING the fruit.

## 2024-08-31 DIAGNOSIS — F411 Generalized anxiety disorder: Secondary | ICD-10-CM | POA: Diagnosis not present

## 2024-08-31 DIAGNOSIS — F331 Major depressive disorder, recurrent, moderate: Secondary | ICD-10-CM | POA: Diagnosis not present

## 2024-09-05 DIAGNOSIS — F411 Generalized anxiety disorder: Secondary | ICD-10-CM | POA: Diagnosis not present

## 2024-09-05 DIAGNOSIS — F331 Major depressive disorder, recurrent, moderate: Secondary | ICD-10-CM | POA: Diagnosis not present

## 2024-09-08 DIAGNOSIS — F331 Major depressive disorder, recurrent, moderate: Secondary | ICD-10-CM | POA: Diagnosis not present

## 2024-09-10 DIAGNOSIS — F331 Major depressive disorder, recurrent, moderate: Secondary | ICD-10-CM | POA: Diagnosis not present

## 2024-09-14 DIAGNOSIS — F411 Generalized anxiety disorder: Secondary | ICD-10-CM | POA: Diagnosis not present

## 2024-09-14 DIAGNOSIS — F331 Major depressive disorder, recurrent, moderate: Secondary | ICD-10-CM | POA: Diagnosis not present

## 2024-09-19 DIAGNOSIS — N898 Other specified noninflammatory disorders of vagina: Secondary | ICD-10-CM | POA: Diagnosis not present

## 2024-09-19 DIAGNOSIS — F331 Major depressive disorder, recurrent, moderate: Secondary | ICD-10-CM | POA: Diagnosis not present

## 2024-09-19 DIAGNOSIS — Z4889 Encounter for other specified surgical aftercare: Secondary | ICD-10-CM | POA: Diagnosis not present

## 2024-09-19 DIAGNOSIS — B9689 Other specified bacterial agents as the cause of diseases classified elsewhere: Secondary | ICD-10-CM | POA: Diagnosis not present

## 2024-09-19 DIAGNOSIS — N760 Acute vaginitis: Secondary | ICD-10-CM | POA: Diagnosis not present

## 2024-09-21 DIAGNOSIS — F331 Major depressive disorder, recurrent, moderate: Secondary | ICD-10-CM | POA: Diagnosis not present

## 2024-09-25 DIAGNOSIS — F331 Major depressive disorder, recurrent, moderate: Secondary | ICD-10-CM | POA: Diagnosis not present

## 2024-09-25 DIAGNOSIS — F411 Generalized anxiety disorder: Secondary | ICD-10-CM | POA: Diagnosis not present

## 2024-09-28 DIAGNOSIS — F331 Major depressive disorder, recurrent, moderate: Secondary | ICD-10-CM | POA: Diagnosis not present

## 2024-09-30 DIAGNOSIS — F331 Major depressive disorder, recurrent, moderate: Secondary | ICD-10-CM | POA: Diagnosis not present

## 2024-10-04 ENCOUNTER — Ambulatory Visit: Admitting: Family

## 2024-10-04 ENCOUNTER — Encounter: Payer: Self-pay | Admitting: Family

## 2024-10-04 VITALS — BP 100/68 | HR 58 | Temp 98.2°F | Ht 69.0 in | Wt 153.4 lb

## 2024-10-04 DIAGNOSIS — H6123 Impacted cerumen, bilateral: Secondary | ICD-10-CM | POA: Diagnosis not present

## 2024-10-04 DIAGNOSIS — R519 Headache, unspecified: Secondary | ICD-10-CM

## 2024-10-04 DIAGNOSIS — Z9889 Other specified postprocedural states: Secondary | ICD-10-CM | POA: Diagnosis not present

## 2024-10-04 DIAGNOSIS — Z0000 Encounter for general adult medical examination without abnormal findings: Secondary | ICD-10-CM

## 2024-10-04 LAB — CBC WITH DIFFERENTIAL/PLATELET
Basophils Absolute: 0.1 K/uL (ref 0.0–0.1)
Basophils Relative: 2.3 % (ref 0.0–3.0)
Eosinophils Absolute: 0.1 K/uL (ref 0.0–0.7)
Eosinophils Relative: 3.7 % (ref 0.0–5.0)
HCT: 34 % — ABNORMAL LOW (ref 36.0–46.0)
Hemoglobin: 11.5 g/dL — ABNORMAL LOW (ref 12.0–15.0)
Lymphocytes Relative: 45.5 % (ref 12.0–46.0)
Lymphs Abs: 1.8 K/uL (ref 0.7–4.0)
MCHC: 33.7 g/dL (ref 30.0–36.0)
MCV: 89 fl (ref 78.0–100.0)
Monocytes Absolute: 0.3 K/uL (ref 0.1–1.0)
Monocytes Relative: 8 % (ref 3.0–12.0)
Neutro Abs: 1.6 K/uL (ref 1.4–7.7)
Neutrophils Relative %: 40.5 % — ABNORMAL LOW (ref 43.0–77.0)
Platelets: 237 K/uL (ref 150.0–400.0)
RBC: 3.82 Mil/uL — ABNORMAL LOW (ref 3.87–5.11)
RDW: 14.2 % (ref 11.5–15.5)
WBC: 3.9 K/uL — ABNORMAL LOW (ref 4.0–10.5)

## 2024-10-04 LAB — LIPID PANEL
Cholesterol: 122 mg/dL (ref 0–200)
HDL: 41.7 mg/dL (ref >39.00)
LDL Cholesterol: 74 mg/dL (ref 0–99)
NonHDL: 80.31
Total CHOL/HDL Ratio: 3
Triglycerides: 32 mg/dL (ref 0.0–149.0)
VLDL: 6.4 mg/dL (ref 0.0–40.0)

## 2024-10-04 LAB — COMPREHENSIVE METABOLIC PANEL WITH GFR
ALT: 9 U/L (ref 0–35)
AST: 16 U/L (ref 0–37)
Albumin: 4.4 g/dL (ref 3.5–5.2)
Alkaline Phosphatase: 56 U/L (ref 39–117)
BUN: 11 mg/dL (ref 6–23)
CO2: 27 meq/L (ref 19–32)
Calcium: 9.6 mg/dL (ref 8.4–10.5)
Chloride: 104 meq/L (ref 96–112)
Creatinine, Ser: 0.7 mg/dL (ref 0.40–1.20)
GFR: 116.44 mL/min (ref >60.00)
Glucose, Bld: 88 mg/dL (ref 70–99)
Potassium: 3.4 meq/L — ABNORMAL LOW (ref 3.5–5.1)
Sodium: 137 meq/L (ref 135–145)
Total Bilirubin: 0.8 mg/dL (ref 0.2–1.2)
Total Protein: 7.5 g/dL (ref 6.0–8.3)

## 2024-10-04 LAB — TSH: TSH: 0.79 u[IU]/mL (ref 0.35–5.50)

## 2024-10-04 MED ORDER — NAPROXEN 500 MG PO TABS: 500.0000 mg | ORAL_TABLET | Freq: Two times a day (BID) | ORAL | 1 refills | Status: AC | PRN

## 2024-10-04 NOTE — Patient Instructions (Addendum)
 It was very nice to see you today!   I will review your lab results via MyChart in a few days. Call the ENT office  and schedule an ear cleaning visit and I would go every 6 months vs once a year to avoid build up.  You look great! Stay well! Get back to exercising!  Have a wonderful holiday :-)    PLEASE NOTE:  If you had any lab tests please let us  know if you have not heard back within a few days. You may see your results on MyChart before we have a chance to review them but we will give you a call once they are reviewed by us . If we ordered any referrals today, please let us  know if you have not heard from their office within the next week.

## 2024-10-04 NOTE — Progress Notes (Signed)
 Phone [phone]  Subjective:   Patient is a 28 y.o. female presenting for annual physical.    Chief Complaint  Patient presents with   Annual Exam    Fasting w/ labs  Discussed the use of AI scribe software for clinical note transcription with the patient, who gave verbal consent to proceed.  History of Present Illness Madison Rocha is a 28 year old female who presents for an annual physical exam and lab work.  She is fasting for labs. She recently had a colposcopy and LEEP for abnormal cells. Follow-up was normal with no current issues other than some post-procedure spotting that has resolved. Next follow-up is planned in a year.  She has recurrent headaches and is trying to identify stress and menstrual triggers. She has not tried Excedrin Migraine. She thinks the headaches are menstrual-related and was advised last visit to take Aleve before and during her period.  She has ongoing constipation symptoms and hemorrhoids, with a sensation of incomplete bowel movements despite soft stools. She uses generic stool softeners and maintains fiber and water intake.  She stopped exercising after the recent GYN procedure and now has severe menstrual cramps, treated with over-the-counter pain medications.  She had jaw surgery for TMJ years ago and having new sx causing her to have limited mouth opening. She sees an ENT annually for ear was buildup and reports having sensation of clogged ears today. Eating and hearing are not significantly affected.  She works in [employer] in an almost full-time role. She has not received a flu shot. She is not using birth control, has not had recent STD testing, and drinks alcohol minimally.  See problem oriented charting- ROS- full  review of systems was completed and negative except for what is noted in HPI above.  The following were reviewed and entered/updated in epic: Past Medical History:  Diagnosis Date   Acute nonintractable headache     Arrhythmia    Bilateral impacted cerumen 12/21/2022   Chest pain 07/13/2024   Worked-up with cardiology, imagine/EKG negative, per pt referred to PCP for further testing, no issues since   Conductive hearing loss, bilateral 12/21/2022   Depression    Fatigue    Heart murmur    Maternal anemia, with delivery - IDA with superimposed ABL anemia 06/20/2020   Medical history non-contributory    Perineal laceration with delivery, first degree 06/20/2020   Post-dates pregnancy 05/26/2022   Postpartum care following vaginal delivery 8/26 06/20/2020   SVD (spontaneous vaginal delivery) 06/19/2020   There are no active problems to display for this patient.  Past Surgical History:  Procedure Laterality Date   COLPOSCOPY N/A 08/03/2024   Procedure: COLPOSCOPY;  Surgeon: Henry Slough, MD;  Location: [facility] OR;  Service: Gynecology;  Laterality: N/A;   LEEP N/A 08/03/2024   Procedure: LEEP (LOOP ELECTROSURGICAL EXCISION PROCEDURE);  Surgeon: Henry Slough, MD;  Location: [facility] OR;  Service: Gynecology;  Laterality: N/A;   MANDIBLE SURGERY  2011    Family History  Problem Relation Age of Onset   Hypertension Mother    Asthma Sister    Heart attack Paternal Grandmother     Medications- reviewed and updated Current Outpatient Medications  Medication Sig Dispense Refill   naproxen (NAPROSYN) 500 MG tablet Take 1 tablet (500 mg total) by mouth 2 (two) times daily as needed for moderate pain (pain score 4-6) or headache (After eating.). 30 tablet 1   No current facility-administered medications for this visit.    Allergies-reviewed and updated  Allergies[1]  Social History   Social History Narrative   Not on file    Objective:  BP 100/68 (BP Location: Left Arm, Patient Position: Sitting, Cuff Size: Normal)   Pulse (!) 58   Temp 98.2 F (36.8 C) (Temporal)   Ht 5' 9 (1.753 m)   Wt 153 lb 6.4 oz (69.6 kg)   LMP 09/30/2024 (Exact Date)   SpO2 99%   BMI 22.65 kg/m  Physical  Exam Vitals and nursing note reviewed.  Constitutional:      Appearance: Normal appearance.  HENT:     Head: Normocephalic.     Right Ear: Tympanic membrane normal. Drainage (bleeding noted after disimpaction) and tenderness (after disimpaction) present. There is impacted cerumen.     Left Ear: There is impacted cerumen. Tympanic membrane is scarred.     Nose: Nose normal.     Mouth/Throat:     Mouth: Mucous membranes are moist.  Eyes:     Pupils: Pupils are equal, round, and reactive to light.  Cardiovascular:     Rate and Rhythm: Normal rate and regular rhythm.  Pulmonary:     Effort: Pulmonary effort is normal.     Breath sounds: Normal breath sounds.  Musculoskeletal:        General: Normal range of motion.     Cervical back: Normal range of motion.  Lymphadenopathy:     Cervical: No cervical adenopathy.  Skin:    General: Skin is warm and dry.  Neurological:     Mental Status: She is alert.  Psychiatric:        Mood and Affect: Mood normal.        Behavior: Behavior normal.     Assessment and Plan   Health Maintenance counseling: 1. Anticipatory guidance: Patient counseled regarding regular dental exams q6 months, eye exams,  avoiding smoking and second hand smoke, limiting alcohol to 1 beverage per day, no illicit drugs.   2. Risk factor reduction:  Advised patient of need for regular exercise and diet rich with fruits and vegetables to reduce risk of heart attack and stroke. Wt Readings from Last 3 Encounters:  10/04/24 153 lb 6.4 oz (69.6 kg)  08/28/24 155 lb 4 oz (70.4 kg)  08/08/24 155 lb 6.4 oz (70.5 kg)   3. Immunizations/screenings/ancillary studies Immunization History  Administered Date(s) Administered   Influenza,inj,Quad PF,6+ Mos 12/13/2022   Influenza-Unspecified 11/26/2022   PPD Test 12/14/2022, 08/08/2024   Tdap 03/31/2020   Health Maintenance Due  Topic Date Due   HPV VACCINES (1 - 3-dose SCDM series) Never done    4. Cervical cancer  screening: 07/2024 - colposcopy w/LEEP 5. Skin cancer screening- advised regular sunscreen use. Denies worrisome, changing, or new skin lesions.  6. Birth control/STD check: none 7. Smoking associated screening: non- smoker 8. Alcohol screening: rare 9. Exercise:  none right now, will start back soon  Assessment & Plan Headache Discussed last visit, did not try Excedrin migraine, potential menstrual-related triggers discussed. - Recommended Excedrin Migraine for relief. - Prescribed naproxen 500mg  bid prn for headache and dysmenorrhea. - Advised against concurrent use of ibuprofen , Aleve, or Advil  with naproxen; Tylenol  permissible. - F/U as needed for ongoing management or worsening sx  Bilateral cerumen impaction - Verbal consent received to perform bilateral ear lavage via Hydrogen peroxide/water mix solution. Curette used to remove visible cerumen. Pt tolerated well, complete evacuation of all cerumen obtained. Mild erythema in left and bleeding in right noted in ear canals after procedure. Pt advised  on gentle water and/or hydrogen peroxide flushes at home as needed. Take Tylenol  or the prescribed Naproxen as soon as back home. - Call ENT office to schedule upcoming visit for disimpaction and schedule more often than annually. - Call office if any concerns.  Dysmenorrhea following cervical procedure Severe cramps post-LEEP expected to last a few months. - Prescribed naproxen for dysmenorrhea. - Advised continuation of over-the-counter medications as recommended by gynecology.  History of cervical dysplasia, post-LEEP Post-LEEP follow-up indicates no further intervention needed. - Continue follow-up with gynecology in one year.  Recurrent cerumen impaction Managed with annual ENT visits, no significant hearing impairment. - Offered  for cerumen removal if needed. - Continue annual ENT visits for ear drainage.  General Health Maintenance Routine health maintenance discussed, no  current birth control use, minimal alcohol consumption. - Order full CPE lab panel today - Encouraged resumption of exercise once pain subsides. - Advised on maintaining a balanced diet with adequate fiber and hydration. - Discussed flu vaccination, declined.  Recommended follow up:  Return for any future concerns, Complete physical w/fasting labs. No future appointments.  Lab/Order associations:  fasting   Kiron Osmun, [place], NP       [1] No Known Allergies

## 2024-10-05 ENCOUNTER — Encounter: Payer: Self-pay | Admitting: Family Medicine

## 2024-10-05 ENCOUNTER — Ambulatory Visit: Admitting: Family Medicine

## 2024-10-05 ENCOUNTER — Ambulatory Visit: Payer: Self-pay

## 2024-10-05 ENCOUNTER — Ambulatory Visit: Payer: Self-pay | Admitting: Family

## 2024-10-05 VITALS — BP 124/68 | HR 78 | Ht 69.0 in | Wt 153.0 lb

## 2024-10-05 DIAGNOSIS — H60593 Other noninfective acute otitis externa, bilateral: Secondary | ICD-10-CM | POA: Diagnosis not present

## 2024-10-05 DIAGNOSIS — F331 Major depressive disorder, recurrent, moderate: Secondary | ICD-10-CM | POA: Diagnosis not present

## 2024-10-05 NOTE — Telephone Encounter (Signed)
Noted, pt scheduled 

## 2024-10-05 NOTE — Progress Notes (Signed)
° °  Acute Office Visit  Subjective:     Patient ID: Madison Rocha, female    DOB: [birth year], 28 y.o.   MRN: [id number]  Chief Complaint  Patient presents with   Ear Pain    The bleeding has stopped, my ears just hurt now    HPI Discussed the use of AI scribe software for clinical note transcription with the patient, who gave verbal consent to proceed.  History of Present Illness   Madison Rocha is a 28 year old female who presents with ear pain following ear cleaning.  She developed bilateral ear pain after an ear cleaning performed yesterday, with worse pain in the right ear where bleeding occurred during the procedure. She has not checked for ongoing bleeding but pain persists and is severe enough that she cannot lie on the right side. The cleaning was done with a tool rather than irrigation. She notes pressure and muffled hearing in the right ear, with milder symptoms in the left. She denies fever or chills. Ibuprofen  has not improved the pain.       Review of Systems  All other systems reviewed and are negative.       Objective:    BP 124/68 (BP Location: Left Arm, Patient Position: Sitting, Cuff Size: Normal)   Pulse 78   Ht 5' 9 (1.753 m)   Wt 153 lb (69.4 kg)   LMP 09/30/2024 (Exact Date)   SpO2 99%   BMI 22.59 kg/m    Physical Exam Vitals reviewed.  Constitutional:      Appearance: Normal appearance. She is normal weight.  HENT:     Right Ear: Hearing and ear canal normal. No drainage or swelling. Tympanic membrane is not perforated.     Left Ear: Hearing normal. Laceration (left ear canal there is a skin tear with dried blood) present. No drainage or swelling. Tympanic membrane is not perforated.     Ears:     Comments: The TM's are white in color, not transparent Neurological:     Mental Status: She is alert.     No results found for any visits on 10/05/24.      Assessment & Plan:   Problem List Items Addressed This Visit    None Visit Diagnoses       Other noninfective acute otitis externa of both ears    -  Primary     Assessment and Plan    Noninfective acute otitis externa, bilateral Bilateral ear pain following ear cleaning with wax removal. Left ear more affected with dry blood and mild abrasion in the ear canal. No active bleeding, significant inflammation, or infection. Eardrums intact without perforation. Pain persists despite ibuprofen  use. Likely due to trauma from wax removal. - I offered to prescribe lidocaine  ear drops for her to help relieve pain, however pt states she already has an appointment on Monday with ENT and prefers to wait - Advised follow-up with ENT specialist as scheduled.        No orders of the defined types were placed in this encounter.   No follow-ups on file.  Heron CHRISTELLA Sharper, MD

## 2024-10-05 NOTE — Telephone Encounter (Signed)
 Appt today

## 2024-10-05 NOTE — Telephone Encounter (Signed)
 FYI Only or Action Required?: FYI only for provider: appointment scheduled on this afternoon.  Patient was last seen in primary care on 10/04/2024 by [name], NP.  Called Nurse Triage reporting Otalgia.  Symptoms began yesterday.  Interventions attempted: Nothing.  Symptoms are: gradually worsening.  Triage Disposition: See HCP Within 4 Hours (Or PCP Triage)  Patient/caregiver understands and will follow disposition?: Yes                   Copied from CRM #[id number]. Topic: Clinical - Red Word Triage >> Oct 05, 2024  8:10 AM Treva T wrote: [facility] that prompted transfer to Nurse Triage: Pt calling states she was seen in office for an ear cleaning, at which time she reports her right ear started to bleed.  Pt reports this morning she is having pain in the right ear, as well as it is still bleeding.  Ph. [phone] Reason for Disposition  [1] SEVERE pain (e.g., excruciating) and [2] not improved 2 hours after pain medicine (e.g., acetaminophen  or ibuprofen )  Answer Assessment - Initial Assessment Questions 1. LOCATION: Which ear is involved?     Both  - right is worse 2. ONSET: When did the ear pain start?      yesterday 3. SEVERITY: How bad is the pain?  (Scale 1-10; mild, moderate or severe)     moderate 4. URI SYMPTOMS: Do you have a runny nose or cough?     Nose and throat also hurt 5. FEVER: Do you have a fever? If Yes, ask: What is your temperature, how was it measured, and when did it start?     no 6. CAUSE: Have you been swimming recently?, How often do you use Q-TIPS?, Have you had any recent air travel or scuba diving?     Seen at office and had ears cleaned yesterday 7. OTHER SYMPTOMS: Do you have any other symptoms? (e.g., decreased hearing, dizziness, headache, stiff neck, vomiting)     no  Protocols used: Earache-A-AH

## 2024-10-08 DIAGNOSIS — H9011 Conductive hearing loss, unilateral, right ear, with unrestricted hearing on the contralateral side: Secondary | ICD-10-CM | POA: Diagnosis not present

## 2024-10-08 DIAGNOSIS — H6121 Impacted cerumen, right ear: Secondary | ICD-10-CM | POA: Diagnosis not present

## 2024-10-09 DIAGNOSIS — F411 Generalized anxiety disorder: Secondary | ICD-10-CM | POA: Diagnosis not present

## 2024-10-09 DIAGNOSIS — F331 Major depressive disorder, recurrent, moderate: Secondary | ICD-10-CM | POA: Diagnosis not present

## 2024-10-12 DIAGNOSIS — F331 Major depressive disorder, recurrent, moderate: Secondary | ICD-10-CM | POA: Diagnosis not present

## 2024-10-12 DIAGNOSIS — F411 Generalized anxiety disorder: Secondary | ICD-10-CM | POA: Diagnosis not present

## 2024-10-15 DIAGNOSIS — F331 Major depressive disorder, recurrent, moderate: Secondary | ICD-10-CM | POA: Diagnosis not present

## 2024-10-19 DIAGNOSIS — F331 Major depressive disorder, recurrent, moderate: Secondary | ICD-10-CM | POA: Diagnosis not present

## 2024-10-22 DIAGNOSIS — H6121 Impacted cerumen, right ear: Secondary | ICD-10-CM | POA: Diagnosis not present

## 2024-10-23 NOTE — Progress Notes (Signed)
 Update to 5in5

## 2024-11-01 NOTE — Progress Notes (Signed)
 The patient attended a screening event on 03/15/2024. Per chart review the patient has Madison Comment, NP as her PCP, has Medicaid for insurance, and does not have any SDOH need. Chart review indicates the patients last appt with her PCP was on 10/04/2024.  CHW was able to make contact with the patient for initial f/u. Patient stated she does have Madison Comment, NP as her PCP and has seen her within the last 12 months. Patient still has Medicaid for insurance and declined having SDOH needs at this time. No additional Health equity team support indicated at this time.

## 2025-10-07 ENCOUNTER — Encounter: Admitting: Family
